# Patient Record
Sex: Female | Born: 1935 | Race: White | Hispanic: No | Smoking: Former smoker
Health system: Southern US, Community
[De-identification: ages and names within clinical notes are randomized; demographics above are authoritative.]

## PROBLEM LIST (undated history)

## (undated) ENCOUNTER — Emergency Department: Discharge status: Absent without leave | Payer: Medicare Other

## (undated) DIAGNOSIS — F419 Anxiety disorder, unspecified: Secondary | ICD-10-CM

## (undated) DIAGNOSIS — K859 Acute pancreatitis without necrosis or infection, unspecified: Secondary | ICD-10-CM

## (undated) DIAGNOSIS — M1990 Unspecified osteoarthritis, unspecified site: Secondary | ICD-10-CM

## (undated) DIAGNOSIS — K649 Unspecified hemorrhoids: Secondary | ICD-10-CM

## (undated) DIAGNOSIS — J449 Chronic obstructive pulmonary disease, unspecified: Secondary | ICD-10-CM

## (undated) DIAGNOSIS — K589 Irritable bowel syndrome without diarrhea: Secondary | ICD-10-CM

## (undated) DIAGNOSIS — K5790 Diverticulosis of intestine, part unspecified, without perforation or abscess without bleeding: Secondary | ICD-10-CM

## (undated) DIAGNOSIS — B029 Zoster without complications: Secondary | ICD-10-CM

## (undated) DIAGNOSIS — K222 Esophageal obstruction: Secondary | ICD-10-CM

## (undated) DIAGNOSIS — E785 Hyperlipidemia, unspecified: Secondary | ICD-10-CM

## (undated) DIAGNOSIS — K2270 Barrett's esophagus without dysplasia: Secondary | ICD-10-CM

## (undated) DIAGNOSIS — K449 Diaphragmatic hernia without obstruction or gangrene: Secondary | ICD-10-CM

## (undated) DIAGNOSIS — F329 Major depressive disorder, single episode, unspecified: Secondary | ICD-10-CM

## (undated) DIAGNOSIS — F0390 Unspecified dementia without behavioral disturbance: Secondary | ICD-10-CM

## (undated) DIAGNOSIS — F32A Depression, unspecified: Secondary | ICD-10-CM

## (undated) HISTORY — DX: Diaphragmatic hernia without obstruction or gangrene: K44.9

## (undated) HISTORY — DX: Hyperlipidemia, unspecified: E78.5

## (undated) HISTORY — DX: Esophageal obstruction: K22.2

## (undated) HISTORY — DX: Depression, unspecified: F32.A

## (undated) HISTORY — DX: Acute pancreatitis without necrosis or infection, unspecified: K85.90

## (undated) HISTORY — PX: ABDOMINAL HYSTERECTOMY: SHX81

## (undated) HISTORY — DX: Chronic obstructive pulmonary disease, unspecified: J44.9

## (undated) HISTORY — DX: Irritable bowel syndrome, unspecified: K58.9

## (undated) HISTORY — DX: Unspecified hemorrhoids: K64.9

## (undated) HISTORY — DX: Unspecified osteoarthritis, unspecified site: M19.90

## (undated) HISTORY — DX: Zoster without complications: B02.9

## (undated) HISTORY — DX: Barrett's esophagus without dysplasia: K22.70

## (undated) HISTORY — DX: Anxiety disorder, unspecified: F41.9

## (undated) HISTORY — DX: Diverticulosis of intestine, part unspecified, without perforation or abscess without bleeding: K57.90

## (undated) HISTORY — DX: Major depressive disorder, single episode, unspecified: F32.9

## (undated) HISTORY — PX: CATARACT EXTRACTION: SUR2

## (undated) HISTORY — PX: TONSILLECTOMY: SUR1361

---

## 1996-08-20 DIAGNOSIS — B029 Zoster without complications: Secondary | ICD-10-CM

## 1996-08-20 HISTORY — DX: Zoster without complications: B02.9

## 1997-08-24 ENCOUNTER — Encounter: Admission: RE | Admit: 1997-08-24 | Discharge: 1997-10-21 | Payer: Self-pay | Admitting: Orthopedic Surgery

## 1998-01-13 ENCOUNTER — Other Ambulatory Visit: Admission: RE | Admit: 1998-01-13 | Discharge: 1998-01-13 | Payer: Self-pay | Admitting: Obstetrics & Gynecology

## 1999-01-11 ENCOUNTER — Other Ambulatory Visit: Admission: RE | Admit: 1999-01-11 | Discharge: 1999-01-11 | Payer: Self-pay | Admitting: Obstetrics & Gynecology

## 1999-01-18 ENCOUNTER — Ambulatory Visit (HOSPITAL_COMMUNITY): Admission: RE | Admit: 1999-01-18 | Discharge: 1999-01-18 | Payer: Self-pay | Admitting: Internal Medicine

## 2000-11-12 ENCOUNTER — Encounter: Admission: RE | Admit: 2000-11-12 | Discharge: 2000-11-12 | Payer: Self-pay | Admitting: Internal Medicine

## 2000-11-12 ENCOUNTER — Encounter: Payer: Self-pay | Admitting: Internal Medicine

## 2001-01-06 ENCOUNTER — Encounter (INDEPENDENT_AMBULATORY_CARE_PROVIDER_SITE_OTHER): Payer: Self-pay | Admitting: *Deleted

## 2001-01-06 ENCOUNTER — Other Ambulatory Visit: Admission: RE | Admit: 2001-01-06 | Discharge: 2001-01-06 | Payer: Self-pay | Admitting: Internal Medicine

## 2005-10-31 ENCOUNTER — Encounter (INDEPENDENT_AMBULATORY_CARE_PROVIDER_SITE_OTHER): Payer: Self-pay | Admitting: *Deleted

## 2005-10-31 ENCOUNTER — Encounter: Admission: RE | Admit: 2005-10-31 | Discharge: 2005-10-31 | Payer: Self-pay | Admitting: Internal Medicine

## 2006-01-07 ENCOUNTER — Ambulatory Visit: Payer: Self-pay | Admitting: Internal Medicine

## 2006-01-23 ENCOUNTER — Ambulatory Visit: Payer: Self-pay | Admitting: Internal Medicine

## 2006-01-23 ENCOUNTER — Encounter: Payer: Self-pay | Admitting: Internal Medicine

## 2006-12-12 ENCOUNTER — Ambulatory Visit: Payer: Self-pay | Admitting: Internal Medicine

## 2008-04-08 ENCOUNTER — Encounter (INDEPENDENT_AMBULATORY_CARE_PROVIDER_SITE_OTHER): Payer: Self-pay | Admitting: *Deleted

## 2008-04-08 ENCOUNTER — Encounter: Payer: Self-pay | Admitting: Internal Medicine

## 2008-04-08 ENCOUNTER — Ambulatory Visit (HOSPITAL_COMMUNITY): Admission: RE | Admit: 2008-04-08 | Discharge: 2008-04-08 | Payer: Self-pay | Admitting: Obstetrics and Gynecology

## 2008-04-19 ENCOUNTER — Telehealth: Payer: Self-pay | Admitting: Internal Medicine

## 2008-04-21 DIAGNOSIS — J45909 Unspecified asthma, uncomplicated: Secondary | ICD-10-CM | POA: Insufficient documentation

## 2008-04-21 DIAGNOSIS — Z860100 Personal history of colon polyps, unspecified: Secondary | ICD-10-CM | POA: Insufficient documentation

## 2008-04-21 DIAGNOSIS — K589 Irritable bowel syndrome without diarrhea: Secondary | ICD-10-CM | POA: Insufficient documentation

## 2008-04-21 DIAGNOSIS — K449 Diaphragmatic hernia without obstruction or gangrene: Secondary | ICD-10-CM | POA: Insufficient documentation

## 2008-04-21 DIAGNOSIS — H269 Unspecified cataract: Secondary | ICD-10-CM | POA: Insufficient documentation

## 2008-04-21 DIAGNOSIS — F329 Major depressive disorder, single episode, unspecified: Secondary | ICD-10-CM | POA: Insufficient documentation

## 2008-04-21 DIAGNOSIS — K5730 Diverticulosis of large intestine without perforation or abscess without bleeding: Secondary | ICD-10-CM | POA: Insufficient documentation

## 2008-04-21 DIAGNOSIS — K859 Acute pancreatitis without necrosis or infection, unspecified: Secondary | ICD-10-CM | POA: Insufficient documentation

## 2008-04-21 DIAGNOSIS — F3289 Other specified depressive episodes: Secondary | ICD-10-CM | POA: Insufficient documentation

## 2008-04-21 DIAGNOSIS — K222 Esophageal obstruction: Secondary | ICD-10-CM | POA: Insufficient documentation

## 2008-04-21 DIAGNOSIS — F411 Generalized anxiety disorder: Secondary | ICD-10-CM | POA: Insufficient documentation

## 2008-04-23 ENCOUNTER — Ambulatory Visit: Payer: Self-pay | Admitting: Internal Medicine

## 2008-04-23 LAB — CONVERTED CEMR LAB
ALT: 13 units/L (ref 0–35)
AST: 20 units/L (ref 0–37)
Albumin: 4 g/dL (ref 3.5–5.2)
Alkaline Phosphatase: 60 units/L (ref 39–117)
Amylase: 102 units/L (ref 27–131)
Bilirubin, Direct: 0.1 mg/dL (ref 0.0–0.3)
Lipase: 38 units/L (ref 11.0–59.0)
Total Bilirubin: 0.8 mg/dL (ref 0.3–1.2)
Total Protein: 7.3 g/dL (ref 6.0–8.3)

## 2008-04-29 ENCOUNTER — Ambulatory Visit (HOSPITAL_COMMUNITY): Admission: RE | Admit: 2008-04-29 | Discharge: 2008-04-29 | Payer: Self-pay | Admitting: Internal Medicine

## 2010-09-10 ENCOUNTER — Encounter: Payer: Self-pay | Admitting: Internal Medicine

## 2010-09-14 ENCOUNTER — Telehealth: Payer: Self-pay | Admitting: Internal Medicine

## 2010-09-15 ENCOUNTER — Other Ambulatory Visit: Payer: Self-pay | Admitting: Physician Assistant

## 2010-09-15 ENCOUNTER — Ambulatory Visit
Admission: RE | Admit: 2010-09-15 | Discharge: 2010-09-15 | Payer: Self-pay | Source: Home / Self Care | Attending: Internal Medicine | Admitting: Internal Medicine

## 2010-09-15 DIAGNOSIS — J449 Chronic obstructive pulmonary disease, unspecified: Secondary | ICD-10-CM | POA: Insufficient documentation

## 2010-09-15 DIAGNOSIS — K219 Gastro-esophageal reflux disease without esophagitis: Secondary | ICD-10-CM | POA: Insufficient documentation

## 2010-09-15 DIAGNOSIS — J4489 Other specified chronic obstructive pulmonary disease: Secondary | ICD-10-CM | POA: Insufficient documentation

## 2010-09-15 DIAGNOSIS — K649 Unspecified hemorrhoids: Secondary | ICD-10-CM | POA: Insufficient documentation

## 2010-09-15 DIAGNOSIS — K5732 Diverticulitis of large intestine without perforation or abscess without bleeding: Secondary | ICD-10-CM | POA: Insufficient documentation

## 2010-09-15 DIAGNOSIS — R1032 Left lower quadrant pain: Secondary | ICD-10-CM | POA: Insufficient documentation

## 2010-09-15 DIAGNOSIS — M129 Arthropathy, unspecified: Secondary | ICD-10-CM | POA: Insufficient documentation

## 2010-09-15 DIAGNOSIS — E785 Hyperlipidemia, unspecified: Secondary | ICD-10-CM | POA: Insufficient documentation

## 2010-09-15 LAB — CBC WITH DIFFERENTIAL/PLATELET
Basophils Absolute: 0 10*3/uL (ref 0.0–0.1)
Basophils Relative: 0.3 % (ref 0.0–3.0)
Eosinophils Absolute: 0.1 10*3/uL (ref 0.0–0.7)
Eosinophils Relative: 0.5 % (ref 0.0–5.0)
HCT: 37.6 % (ref 36.0–46.0)
Hemoglobin: 12.9 g/dL (ref 12.0–15.0)
Lymphocytes Relative: 15 % (ref 12.0–46.0)
Lymphs Abs: 1.7 10*3/uL (ref 0.7–4.0)
MCHC: 34.2 g/dL (ref 30.0–36.0)
MCV: 92 fl (ref 78.0–100.0)
Monocytes Absolute: 0.8 10*3/uL (ref 0.1–1.0)
Monocytes Relative: 7.1 % (ref 3.0–12.0)
Neutro Abs: 8.9 10*3/uL — ABNORMAL HIGH (ref 1.4–7.7)
Neutrophils Relative %: 77.1 % — ABNORMAL HIGH (ref 43.0–77.0)
Platelets: 248 10*3/uL (ref 150.0–400.0)
RBC: 4.09 Mil/uL (ref 3.87–5.11)
RDW: 13.4 % (ref 11.5–14.6)
WBC: 11.5 10*3/uL — ABNORMAL HIGH (ref 4.5–10.5)

## 2010-09-21 NOTE — Progress Notes (Signed)
Summary: TRIAGE-? DIVERTICULITIS   Phone Note Call from Patient Call back at Home Phone [phone]   Caller: Patient Call For: Debbie Morris Reason for Call: Talk to Nurse Summary of Call: PT STILL HAS DISCONFORT ON HER LLQ WOULD LIKE TO SPEAK TO NURSE Initial call taken by: Tawni Levy,  April 19, 2008 3:24 PM  Follow-up for Phone Call        Pt. states she has had a CT by Dr.Mysinger and labs by Dr.Polite and needs an appointment to review CT with Dr.Brodie. ?diverticulitis.  1) Appt. with Dr.Brodie on 04-23-08 at 2:30pm 2) I have requested records from Dr.Polite and Dr.mysinger, to be faxed over. 3) Pt. instructed to call back as needed.  Follow-up by: Laureen Ochs LPN,  April 19, 2008 3:48 PM

## 2010-09-21 NOTE — Procedures (Signed)
Summary: EGD   EGD  Procedure date:  01/23/2006  Findings:      Location: [facility]   Patient Name: Debbie Morris, [name] MRN:  Procedure Procedures: Panendoscopy (EGD) CPT: 43235.    with biopsy(s)/brushing(s). CPT: D1846139.    with esophageal dilation. CPT: G9296129.  Personnel: Endoscopist: Dora L. Juanda Chance, MD.  Exam Location: Exam performed in Outpatient Clinic. Outpatient  Patient Consent: Procedure, Alternatives, Risks and Benefits discussed, consent obtained, from patient. Consent was obtained by the RN.  Indications Symptoms: Dysphagia. Chest Pain. Reflux symptoms  Surveillance of: 2002.  History  Current Medications: Patient is taking a non-steroidal medication. Patient is not currently taking Coumadin.  Pre-Exam Physical: Performed Jan 23, 2006  Entire physical exam was normal.  Comments: Pt. history reviewed/updated, physical exam performed prior to initiation of sedation?yes Exam Exam Info: Maximum depth of insertion Duodenum, intended Duodenum. Vocal cords visualized. Gastric retroflexion performed. Images taken. ASA Classification: II. Tolerance: good.  Sedation Meds: Patient assessed and found to be appropriate for moderate (conscious) sedation. Fentanyl 50 mcg. given IV. Versed 7.5 mg. given IV. Cetacaine Spray 2 sprays given aerosolized.  Monitoring: BP and pulse monitoring done. Oximetry used. Supplemental O2 given  Findings - STRICTURE / STENOSIS: Stricture in Distal Esophagus.  Constriction: partial. Etiology: benign due to reflux. 41 cm from mouth. Lumen diameter is 16 mm. Biopsy of Stricture/Steno  taken. ICD9: Esophageal Stricture: 530.3.  - Dilation: Distal Esophagus. Savary dilator used, Diameter: 16 mm, Minimal Resistance, Minimal Heme present on extraction. 1  total dilators used. Patient tolerance excellent. Outcome: successful.  - HIATAL HERNIA: 2 cms. in length. reducible hernia. ICD9: Hernia, Hiatal: 553.3. Normal: Antrum.    Normal: Duodenal Apex.   Assessment Abnormal examination, see findings above.  Diagnoses: 553.3: Hernia, Hiatal.  530.3: Esophageal Stricture.   Comments: nonobstructing stricture, s/p passage of 16 mm dilator Events  Unplanned Intervention: No unplanned interventions were required.  Unplanned Events: There were no complications. Plans Medication(s): Await pathology. PPI: Omeprazole/Prilosec 20 mg QD, starting Jan 23, 2006   Disposition: After procedure patient sent to recovery. After recovery patient sent home.   PATHOLOGY  FINAL DIAGNOSIS    ***MICROSCOPIC EXAMINATION AND DIAGNOSIS***    1. ESOPHAGUS, GASTROESOPHAGEAL JUNCTION, ENDOSCOPIC BIOPSY:   - GASTRIC OXYNTOCARDIAC-TYPE MUCOSA WITH MILD CHRONIC   INFLAMMATION,   NON-SPECIFIC.   - NO SQUAMOUS MUCOSA PRESENT.   - NO INTESTINAL METAPLASIA, DYSPLASIA, OR MALIGNANCY   IDENTIFIED.    2. COLON, CECUM, POLYP: COLON, POLYP: TUBULAR ADENOMA. NO HIGH   GRADE DYSPLASIA OR MALIGNANCY IDENTIFIED.    COMMENT   1. An Alcian Blue stain is performed to determine the presence of   intestinal metaplasia (goblet cell metaplasia). No intestinal   metaplasia (goblet cell metaplasia) is identified with the Alcian   Blue stain. There are a small number of Alcian Blue-positive   gastric-type mucous glands; however, no cells with the   appropriate shape of goblet cells are identified. The control   stained appropriately. (LI:gt, 01/25/06)    gdt   Date Reported: 01/26/2006 Sonia Side, MD   *** Electronically Signed Out By LI ***    Clinical information   Dysphagia, screening colon R/O Barrett' s (jes)    specimen(s) obtained   1: Esophagogastric junction, biopsy   2: Colon, polyp(s), cecum    Gross Description   1. Received in formalin are tan, soft tissue fragments that are   submitted in toto. Number: 2   Size: 0.3 and 0.4 cm,  total in cassette    2. Received in formalin is a tan, soft tissue fragment that is    submitted in toto. Size: 0.2 cm total in cassette   (BM:jes, 01/25/06) This report was created from the original endoscopy report, which was reviewed and signed by the above listed endoscopist.

## 2010-09-21 NOTE — Progress Notes (Signed)
Summary: [place]  Patient has not been seen si Phone Note Call from Patient Call back at Home Phone [phone]   Caller: Patient Call For: Dr. Juanda Chance Reason for Call: Talk to Nurse Summary of Call: Pt is having a lot of discomfort and does not think she can make it through another night without having some medication or coming in to see [place] Initial call taken by: [place] Johnson,  September 14, 2010 11:06 AM  Follow-up for Phone Call        Patient stated she's been having abdominal pain x 1 week. Pain is located along her pubic hairline toward the left and is like cramping when you need to have a BM. She also had diarrhea also but started taking Align, Imodium and GasX and it's better. Pain kept her up all night. Patient has old Bentyl tabs. Patient given an appointment with Mike Gip PAC in am. Dr Juanda Chance, can I refill the Bentyl? Thanks.Graciella Freer RN  September 14, 2010 3:09 PM  Chart requested. Graciella Freer RN  September 14, 2010 3:14 PM   Additional Follow-up for Phone Call Additional follow up Details #1::        yes, OK to refill Bentyl 10mg , 1 by mouth ac, as needed crampy abd. pain. Pt to see Amy. Additional Follow-up by: Hart Carwin MD,  September 14, 2010 5:34 PM    Additional Follow-up for Phone Call Additional follow up Details #2::    Spoke with patient and she took Bentyl - expired tabs- yesterday after our conversation. Ok per her to refill per Dr Regino Schultze note. Follow-up by: Graciella Freer RN,  September 15, 2010 8:21 AM  Prescriptions: BENTYL 10 MG CAPS (DICYCLOMINE HCL) Take 1 tablet by mouth as needed for abdominal spasms  #30 x 2   Entered by:   Graciella Freer RN   Authorized by:   Hart Carwin MD   Signed by:   Graciella Freer RN on 09/15/2010   Method used:   [facility]  [address] [phone]* (retail)       [address]       [place], [place]  [id number]       Ph: [phone] or [phone]  Fax: [phone]   RxID:   [id number]

## 2010-09-21 NOTE — Procedures (Signed)
Summary: COLON   Procedures Next Due Date:    Colonoscopy: 01/2011  Patient Name: Debbie, Morris MRN:  Procedure Procedures: Colonoscopy CPT: 3651194019.    with biopsy. CPT: Q5068410.  Personnel: Endoscopist: Dora L. Juanda Chance, MD.  Exam Location: Exam performed in Outpatient Clinic. Outpatient  Patient Consent: Procedure, Alternatives, Risks and Benefits discussed, consent obtained, from patient. Consent was obtained by the RN.  Indications  Surveillance of: Adenomatous Polyp(s). Initial polypectomy was performed in 1998. 1-2 Polyps were found at Index Exam. Previous surveillance exam(s) in  2002,  History  Current Medications: Patient is taking an non-steroidal medication. Patient is not currently taking Coumadin.  Pre-Exam Physical: Performed Jan 23, 2006. Entire physical exam was normal.  Comments: Pt. history reviewed/updated, physical exam performed prior to initiation of sedation?yes Exam Exam: Extent of exam reached: Cecum, extent intended: Cecum.  The cecum was identified by appendiceal orifice and IC valve. Colon retroflexion performed. Images taken. ASA Classification: II. Tolerance: excellent.  Monitoring: Pulse and BP monitoring, Oximetry used. Supplemental O2 given.  Colon Prep Used Miralax for colon prep. Prep results: good.  Sedation Meds: Patient assessed and found to be appropriate for moderate (conscious) sedation. Fentanyl 50 mcg. given IV. Versed 4.5 mg. given IV.  Findings POLYP: Cecum, Maximum size: 3 mm. diminutive, sessile polyp. Distance from Anus 110 cm. Procedure:  biopsy without cautery, The polyp was removed piece meal. removed, retrieved, Polyp sent to pathology. ICD9: Colon Polyps: 211.3.  - DIVERTICULOSIS: Descending Colon to Sigmoid Colon. ICD9: Diverticulosis: 562.10. Comments: left and right colon diverticulosis.  - NORMAL EXAM: Rectum.   Assessment Abnormal examination, see findings above.  Diagnoses: 211.3: Colon Polyps.    562.10: Diverticulosis.   Comments: diminutive cecal polyp removed Events  Unplanned Interventions: No intervention was required.  Unplanned Events: There were no complications. Plans Medication Plan: Await pathology.  Patient Education: Patient given standard instructions for: Yearly hemoccult testing recommended. Patient instructed to get routine colonoscopy every 5 years.  Disposition: After procedure patient sent to recovery. After recovery patient sent home.   PATHOLOGY  FINAL DIAGNOSIS    ***MICROSCOPIC EXAMINATION AND DIAGNOSIS***    1. ESOPHAGUS, GASTROESOPHAGEAL JUNCTION, ENDOSCOPIC BIOPSY:   - GASTRIC OXYNTOCARDIAC-TYPE MUCOSA WITH MILD CHRONIC   INFLAMMATION,   NON-SPECIFIC.   - NO SQUAMOUS MUCOSA PRESENT.   - NO INTESTINAL METAPLASIA, DYSPLASIA, OR MALIGNANCY   IDENTIFIED.    2. COLON, CECUM, POLYP: COLON, POLYP: TUBULAR ADENOMA. NO HIGH   GRADE DYSPLASIA OR MALIGNANCY IDENTIFIED.    COMMENT   1. An Alcian Blue stain is performed to determine the presence of   intestinal metaplasia (goblet cell metaplasia). No intestinal   metaplasia (goblet cell metaplasia) is identified with the Alcian   Blue stain. There are a small number of Alcian Blue-positive   gastric-type mucous glands; however, no cells with the   appropriate shape of goblet cells are identified. The control   stained appropriately. (LI:gt, 01/25/06)    gdt   Date Reported: 01/26/2006 Sonia Side, MD   *** Electronically Signed Out By LI ***    Clinical information   Dysphagia, screening colon R/O Barrett' s (jes)    specimen(s) obtained   1: Esophagogastric junction, biopsy   2: Colon, polyp(s), cecum    Gross Description   1. Received in formalin are tan, soft tissue fragments that are   submitted in toto. Number: 2   Size: 0.3 and 0.4 cm, total in cassette    2. Received in formalin is a  tan, soft tissue fragment that is   submitted in toto. Size: 0.2 cm total in cassette    (BM:jes, 01/25/06)  This report was created from the original endoscopy report, which was reviewed and signed by the above listed endoscopist.

## 2010-09-21 NOTE — Assessment & Plan Note (Signed)
Summary: abdominal pain x 1 week    History of Present Illness Visit Type: new patient  Primary GI MD: Lina Sar MD Primary Provider: Elby Showers, MD  Requesting Provider: na Chief Complaint: Lower abd pain, diarrhea, constipation, bloating, rectal pain, and hemorrhoids  History of Present Illness:   Debbie Morris 77 YO FEMALE KNOWN TO DR. Juanda Chance SHE HAS HX OF GERD,COLON POLYPS,DIVERTICULOSIS, AND HAD AN EPISODE OF PANCREATITIS ABOUT 3 YEARS AGO. LAST COLONOSCOPY WAS DONE IN 2007-HAS LEFT AND RIGHT COLON DIVERTICULOSIS,ONE TINY ADENOMATOUS POLYP. SHE COMES IN TODAY AS AN ADD ON WITH C/O LOWER ABDOMINAL PAIN OVER THE PAST 5 DAYS. PAIN HAS BEEN INTENSE AT TIMES. SHE DESCRIBES A HEAVY PRESSURE IN HER LOWER ABDOMEN,SHARP JABBING PAINS, SOMETIMES EVEN IN TO HER RECTUM. SHE HAS BEEN UNCOMFORTABLE WALKING, AND CANNOT LIE ON LEFT SIDE. NO FEVER, CHILLS, NO N/V. HAS HAD Debbie LITTLE BM PAST COUPLE DAYS AND STOOL NARROW AND IN PIECES, NO BLOOD. NO DYSURIA, THOUGH HURTS HER ABDOMEN TO STRAIN.PAIN IS MORE TO LEFT SIDE.    GI Review of Systems    Reports abdominal pain and  bloating.     Location of  Abdominal pain: lower abdomen.    Denies acid reflux, belching, chest pain, dysphagia with liquids, dysphagia with solids, heartburn, loss of appetite, nausea, vomiting, vomiting blood, weight loss, and  weight gain.      Reports change in bowel habits, constipation, diarrhea, hemorrhoids, and  rectal pain.     Denies anal fissure, black tarry stools, diverticulosis, fecal incontinence, heme positive stool, irritable bowel syndrome, jaundice, light color stool, liver problems, and  rectal bleeding.    Current Medications (verified): 1)  Calcium .... Take 1 Tablet By Mouth Once A Day 2)  Vitamin D .... Take 1 Tablet By Mouth Once A Day 3)  Celexa 10 Mg Tabs (Citalopram Hydrobromide) .... Take 1 Tablet By Mouth Once A Day 4)  Multivitamins  Tabs (Multiple Vitamin) .... Take 1 Tablet By Mouth Once A Day 5)   Aspirin 81 Mg Tbec (Aspirin) .... Take 1 Tablet By Mouth Once A Day 6)  Premarin 0.3 Mg Tabs (Estrogens Conjugated) .Marland Kitchen.. 1 Once Daily 7)  Albuterol .... As Needed 8)  Vitamin B Complex-C   Caps (B Complex-C) .Marland Kitchen.. 1 Once Daily 9)  Bentyl 10 Mg Caps (Dicyclomine Hcl) .... Take 1 Tablet By Mouth As Needed For Abdominal Spasms 10)  Glucosamine-Chondroitin 250-200 Mg Caps (Glucosamine-Chondroitin) .... One Capsule By Mouth Once Daily 11)  Pravastatin Sodium 40 Mg Tabs (Pravastatin Sodium) .... One Tablet By Mouth Once Daily 12)  Gas-X 80 Mg Chew (Simethicone) .... As Needed 13)  Align  Caps (Probiotic Product) .... One Capsule By Mouth Once Daily  Allergies (verified): 1)  ! Pcn 2)  ! Erythromycin 3)  ! * Tetanus  Past History:  Past Medical History: HYPERLIPIDEMIA (ICD-272.4)  COPD (ICD-496) ARTHRITIS (ICD-716.90) ACUTE PANCREATITIS (ICD-577.0) CATARACTS (ICD-366.9) DEPRESSION (ICD-311) ANXIETY (ICD-300.00) ASTHMA, UNSPECIFIED (ICD-493.90) Hx of ESOPHAGEAL STRICTURE (ICD-530.3) HIATAL HERNIA (ICD-553.3) COLONIC POLYPS, ADENOMATOUS, HX OF (ICD-V12.72) DIVERTICULOSIS OF COLON (ICD-562.10) HEMORRHOIDS IRRITABLE BOWEL SYNDROME (ICD-564.1)  Past Surgical History: Hysterectomy Tonsillectomy Cataract Surgery  COLONOSCOPY 2007-BRODIE EGD 2007-BRODIE  Family History: Family History of Colon Cancer: Mother Family History of Prostate Cancer: Father Family History of Diabetes: Mother Stomach Cancer: MGM   Social History: [employer] AT [employer] 1/12 Divorced No childern  Alcohol Use - yes-on occasion Daily Caffeine Use Illicit Drug Use - no Patient has never smoked.  Smoking Status:  never  Review of Systems       The patient complains of arthritis/joint pain.  The patient denies allergy/sinus, anemia, anxiety-new, back pain, blood in urine, breast changes/lumps, change in vision, confusion, cough, coughing up blood, depression-new, fainting, fatigue, fever,  headaches-new, hearing problems, heart murmur, heart rhythm changes, itching, menstrual pain, muscle pains/cramps, night sweats, nosebleeds, pregnancy symptoms, shortness of breath, skin rash, sleeping problems, sore throat, swelling of feet/legs, swollen lymph glands, thirst - excessive , urination - excessive , urination changes/pain, urine leakage, vision changes, and voice change.         SEE HPI  Vital Signs:  Patient profile:   77 year old female Height:      62 inches Weight:      155 pounds BMI:     28.45 BSA:     1.72 Pulse rate:   68 / minute Pulse rhythm:   regular BP sitting:   112 / 58  (left arm) Cuff size:   regular  Vitals Entered By: Ok Anis CMA (September 15, 2010 10:46 AM)  Physical Exam  General:  Well developed, well nourished, no acute distress. Head:  Normocephalic and atraumatic. Eyes:  PERRLA, no icterus. Lungs:  Clear throughout to auscultation. Heart:  Regular rate and rhythm; no murmurs, rubs,  or bruits. Abdomen:  SOFT, TENDER LLQ/LMQ, MILD SUPRAPUBIC, NO MASS OR HSM, NO GUARDING OR REBOUND Rectal:  NOT DONE Extremities:  No clubbing, cyanosis, edema or deformities noted. Neurologic:  Alert and  oriented x4;  grossly normal neurologically. Psych:  Alert and cooperative. Normal mood and affect.   Impression & Recommendations:  Problem # 1:  DIVERTICULITIS OF COLON (ICD-562.11) Assessment New 77 YO FEMALE WITH ACUTE DIVERTICULITIS WITH 5 DAY HX OF LLQ PAIN  SOFT/BLAND DIET BENTYL 10 MG 3 X DAILY AS NEEDED FOR CRAMPING TYLENOL AS NEEDED FOR PAIN START CIPRO 500 MG TWICE DAILY X 2 WEEKS FLAGYL 500 MG TWICE DAILY X 2 WEEKS ROV WITH DR. Lanny Cramp WILL BE DUE FOR FOLLOW UP COLONOSCOPY PT  AWARE SHE SHOULD CALL IF HER SXS WORSEN AT ANY POINT, OR IF SHE HAS PERSISTENT SXS AFTER COMPLETING THE ANTIBIOTICS. Orders: TLB-CBC Platelet - w/Differential ([id number])  Problem # 2:  IRRITABLE BOWEL SYNDROME (ICD-564.1) Assessment: Comment Only  Problem  # 3:  COLONIC POLYPS,,ADENOMATOUS HX OF (ICD-V12.72) Assessment: Comment Only DUE FOR FLLOW UP  01/2011   Problem # 4:  ASTHMA, UNSPECIFIED (ICD-493.90) Assessment: Comment Only  Patient Instructions: 1)  Please go to lab, basement level. 2)  We sent perscriptions for the Antibiotics and the [facility]ol [facility] [address]  3)  We made you a follow up appointment with Dr. Juanda Chance, appointment card given. 4)  Copy sent to : Elby Showers, MD 5)  The medication list was reviewed and reconciled.  All changed / newly prescribed medications were explained.  A complete medication list was provided to the patient / caregiver. Prescriptions: METRONIDAZOLE 500 MG TABS (METRONIDAZOLE) Take 1 tab twice x 14 days  #28 x 0   Entered by:   Lowry Ram NCMA   Authorized by:   Sammuel Cooper PA-c   Signed by:   Lowry Ram NCMA on 09/15/2010   Method used:   [facility]  [address] [phone][facility] (retail)       [address]       [place], [place]  [address]  Ph: [phone] or [phone]       Fax: [phone]   RxID:   [id number] CIPRO 500 MG TABS (CIPROFLOXACIN HCL) Take 1 tab twice daily x 14 days  #28 x 0   Entered by:   Lowry Ram NCMA   Authorized by:   Sammuel Cooper PA-c   Signed by:   Lowry Ram NCMA on 09/15/2010   Method used:   [facility]        [facility]  [address] [phone]* (retail)       [address]       [place], [place]  [id number]       Ph: [phone] or [phone]       Fax: [phone]   RxID:   [id number] ANUSOL-HC 25 MG SUPP (HYDROCORTISONE ACETATE) Use 1 suppository at bedtime as needed  #15 x 2   Entered by:   Lowry Ram NCMA   Authorized by:   Sammuel Cooper PA-c   Signed by:   Lowry Ram NCMA on 09/15/2010   Method used:   [facility]        [facility]  [address] [phone]* (retail)       [address]       [place], [place]  [id number]       Ph: [phone] or [phone]       Fax: [phone]   RxID:   [id number]

## 2010-09-22 ENCOUNTER — Encounter (INDEPENDENT_AMBULATORY_CARE_PROVIDER_SITE_OTHER): Payer: Self-pay | Admitting: *Deleted

## 2010-09-22 ENCOUNTER — Telehealth: Payer: Self-pay | Admitting: Physician Assistant

## 2010-09-22 ENCOUNTER — Other Ambulatory Visit: Payer: Self-pay | Admitting: Physician Assistant

## 2010-09-22 ENCOUNTER — Other Ambulatory Visit: Payer: Medicare Other

## 2010-09-22 DIAGNOSIS — R1032 Left lower quadrant pain: Secondary | ICD-10-CM

## 2010-09-22 LAB — CBC WITH DIFFERENTIAL/PLATELET
Basophils Absolute: 0 10*3/uL (ref 0.0–0.1)
Basophils Relative: 0.4 % (ref 0.0–3.0)
Eosinophils Absolute: 0.1 10*3/uL (ref 0.0–0.7)
Eosinophils Relative: 1.4 % (ref 0.0–5.0)
HCT: 35.2 % — ABNORMAL LOW (ref 36.0–46.0)
Hemoglobin: 12 g/dL (ref 12.0–15.0)
Lymphocytes Relative: 24.3 % (ref 12.0–46.0)
Lymphs Abs: 1.4 10*3/uL (ref 0.7–4.0)
MCHC: 34.2 g/dL (ref 30.0–36.0)
MCV: 91.6 fl (ref 78.0–100.0)
Monocytes Absolute: 0.6 10*3/uL (ref 0.1–1.0)
Monocytes Relative: 11.4 % (ref 3.0–12.0)
Neutro Abs: 3.6 10*3/uL (ref 1.4–7.7)
Neutrophils Relative %: 62.5 % (ref 43.0–77.0)
Platelets: 257 10*3/uL (ref 150.0–400.0)
RBC: 3.85 Mil/uL — ABNORMAL LOW (ref 3.87–5.11)
RDW: 13.1 % (ref 11.5–14.6)
WBC: 5.7 10*3/uL (ref 4.5–10.5)

## 2010-09-22 LAB — URINALYSIS, ROUTINE W REFLEX MICROSCOPIC
Bilirubin Urine: NEGATIVE
Hgb urine dipstick: NEGATIVE
Ketones, ur: NEGATIVE
Nitrite: NEGATIVE
Specific Gravity, Urine: >=1.03 (ref 1.000–1.030)
Total Protein, Urine: NEGATIVE
Urine Glucose: NEGATIVE
Urobilinogen, UA: 0.2 (ref 0.0–1.0)
pH: 5.5 (ref 5.0–8.0)

## 2010-09-26 ENCOUNTER — Encounter: Payer: Self-pay | Admitting: Physician Assistant

## 2010-09-26 ENCOUNTER — Ambulatory Visit (INDEPENDENT_AMBULATORY_CARE_PROVIDER_SITE_OTHER): Payer: Medicare Other | Admitting: Physician Assistant

## 2010-09-26 DIAGNOSIS — K5732 Diverticulitis of large intestine without perforation or abscess without bleeding: Secondary | ICD-10-CM

## 2010-10-05 NOTE — Assessment & Plan Note (Signed)
Summary: abd pain, hx Brodie, No show, copay/[facility]   Vital Signs:  Patient profile:   77 year old female Height:      62 inches Weight:      155 pounds BMI:     28.45 Pulse rate:   72 / minute Pulse rhythm:   regular BP sitting:   118 / 52  (left arm)  Vitals Entered By: Chales Abrahams CMA Duncan Dull) (September 26, 2010 2:28 PM)  History of Present Illness Visit Type: Follow-up Visit Primary GI MD: Lina Sar MD Primary Provider: Elby Showers, MD  Requesting Provider: na Chief Complaint: stool looks like coffe grounds, pain much better History of Present Illness:   VERY NICE 77 YO FEMALE WHO COMES BACK IN TODAY FOR FOLLOW UP OF DIVERTICULITIS. SHE WAS SEEN 1/27 WITH ACUTE LOWER ABDOMINAL PAIN WHICH WAS FAIRLY INTENSE. SHE HAS BEEN ON CIPRO AND FLAGYL  OVER THE PAST 10 DAYS . SHE IS ACTUALLY FEELING QUITE A BIT BETTER. SHE CONTINUES TO HAVE SOME MILD LLQ PAIN WHICH SHE DESRIBES AS A DULL ACHE. NO FEVERS. BM'S SOFT PIECES. SHE SAYS HER URINE IS DARK. LABS WERE DONE  2/3/  WHEN SHE CALLED IN . WBC 5.7, HGB 12.0, UA NEGATIVE   GI Review of Systems    Reports abdominal pain.     Location of  Abdominal pain: lower abdomen.    Denies acid reflux, belching, bloating, chest pain, dysphagia with liquids, dysphagia with solids, heartburn, loss of appetite, nausea, vomiting, vomiting blood, weight loss, and  weight gain.        Denies anal fissure, black tarry stools, change in bowel habit, constipation, diarrhea, diverticulosis, fecal incontinence, heme positive stool, hemorrhoids, irritable bowel syndrome, jaundice, light color stool, liver problems, rectal bleeding, and  rectal pain.  Current Medications (verified): 1)  Calcium .... Take 1 Tablet By Mouth Once A Day 2)  Vitamin D .... Take 1 Tablet By Mouth Once A Day 3)  Celexa 10 Mg Tabs (Citalopram Hydrobromide) .... Take 1 Tablet By Mouth Once A Day 4)  Multivitamins  Tabs (Multiple Vitamin) .... Take 1 Tablet By Mouth Once A Day 5)   Aspirin 81 Mg Tbec (Aspirin) .... Take 1 Tablet By Mouth Once A Day 6)  Premarin 0.3 Mg Tabs (Estrogens Conjugated) .Marland Kitchen.. 1 Once Daily 7)  Albuterol .... As Needed 8)  Vitamin B Complex-C   Caps (B Complex-C) .Marland Kitchen.. 1 Once Daily 9)  Bentyl 10 Mg Caps (Dicyclomine Hcl) .... Take 1 Tablet By Mouth As Needed For Abdominal Spasms 10)  Glucosamine-Chondroitin 250-200 Mg Caps (Glucosamine-Chondroitin) .... One Capsule By Mouth Once Daily 11)  Pravastatin Sodium 40 Mg Tabs (Pravastatin Sodium) .... One Tablet By Mouth Once Daily 12)  Gas-X 80 Mg Chew (Simethicone) .... As Needed 13)  Align  Caps (Probiotic Product) .... One Capsule By Mouth Once Daily 14)  Anusol-Hc 25 Mg Supp (Hydrocortisone Acetate) .... Use 1 Suppository At Bedtime As Needed 15)  Cipro 500 Mg Tabs (Ciprofloxacin Hcl) .... Take 1 Tab Twice Daily X 14 Days 16)  Metronidazole 500 Mg Tabs (Metronidazole) .... Take 1 Tab Twice X 14 Days  Allergies (verified): 1)  ! Pcn 2)  ! Erythromycin 3)  ! * Tetanus  Past History:  Past Medical History: HYPERLIPIDEMIA (ICD-272.4) COPD (ICD-496) ARTHRITIS (ICD-716.90) ACUTE PANCREATITIS (ICD-577.0) CATARACTS (ICD-366.9) DEPRESSION (ICD-311) ANXIETY (ICD-300.00) ASTHMA, UNSPECIFIED (ICD-493.90) Hx of ESOPHAGEAL STRICTURE (ICD-530.3) HIATAL HERNIA (ICD-553.3) COLONIC POLYPS, ADENOMATOUS, HX OF (ICD-V12.72) DIVERTICULOSIS OF COLON (ICD-562.10) HEMORRHOIDS IRRITABLE BOWEL  SYNDROME (ICD-564.1)  Past Surgical History: Reviewed history from 09/15/2010 and no changes required. Hysterectomy Tonsillectomy Cataract Surgery  COLONOSCOPY 2007-[facility] EGD 2007-[facility]  Family History: Reviewed history from 09/15/2010 and no changes required. Family History of Colon Cancer: Mother Family History of Prostate Cancer: Father Family History of Diabetes: Mother Stomach Cancer: MGM   Social History: Reviewed history from 09/15/2010 and no changes required. Retired,QUIT JOB AT [employer]  1/12 Divorced No childern  Alcohol Use - yes-on occasion Daily Caffeine Use Illicit Drug Use - no Patient has never smoked.   Review of Systems  The patient denies allergy/sinus, anemia, anxiety-new, arthritis/joint pain, back pain, blood in urine, breast changes/lumps, change in vision, confusion, cough, coughing up blood, depression-new, fainting, fatigue, fever, headaches-new, hearing problems, heart murmur, heart rhythm changes, itching, menstrual pain, muscle pains/cramps, night sweats, nosebleeds, pregnancy symptoms, shortness of breath, skin rash, sleeping problems, sore throat, swelling of feet/legs, swollen lymph glands, thirst - excessive , urination - excessive , urination changes/pain, urine leakage, vision changes, and voice change.         SEE HPI  Physical Exam  General:  Well developed, well nourished, no acute distress. Head:  Normocephalic and atraumatic. Eyes:  PERRLA, no icterus. Lungs:  Clear throughout to auscultation. Heart:  Regular rate and rhythm; no murmurs, rubs,  or bruits. Abdomen:  SOFT, MINIMALLY TENDER LLQ, NO GUARDING, NO REBOUND BS+ Rectal:  NOT DONE Extremities:  No clubbing, cyanosis, edema or deformities noted. Neurologic:  Alert and  oriented x4;  grossly normal neurologically. Psych:  Alert and cooperative. Normal mood and affect.  Impression & Recommendations:  Problem # 1:  DIVERTICULITIS OF COLON (ICD-562.11) Assessment Improved 77 YO FEMALE WITH ACUTE DIVERTICULITIS-IMPROVING. SHE IS ADVISED TO COMPLETE THE 14 DAY COURSE OF CIPRO AND FLAGYL RESUME BENEFIBER RESUME ALIGN ONE DAILY ADVANCE TO REGULAR DIET. SHE WILL CALL AFTER SHE FINISHES THE ABX-IF ANY PERSISTENT PAIN WOULD TREAT WITH ANOTHER WEEK OF CIPRO 500 two times a day, AND FLAGYL 500 MG two times a day. SHE HAS  A ROUTINE FOLLOW UP WITH DR. Juanda Chance IN [facility].  Patient Instructions: 1)  Follow up as needed. 2)  The medication list was reviewed and reconciled.  All changed /  newly prescribed medications were explained.  A complete medication list was provided to the patient / caregiver.

## 2010-10-05 NOTE — Progress Notes (Signed)
Summary: Triage   Phone Note Call from Patient Call back at Home Phone [phone]   Caller: Patient Call For: Debbie Morris Reason for Call: Talk to Nurse Summary of Call: Dark stools, dark urine Initial call taken by: Karna Christmas,  September 22, 2010 10:09 AM  Follow-up for Phone Call        Patient calling to let us know how she is doing. States the severe pain is gone but the left lower side is still tender and a constant dull ache. Stools are dark in color and mushy. Patient could not tell me if they were dark brown or black. States she had 3-4 stools/day. Denies bleeding. She it taking her antibiotics and eating a bland diet. She has not taken the Bentyl in several days. She states she stopped her Pravastatin on her own. She wants to know if she needs an xray. Please, advise. Follow-up by: Jesse Fall RN,  September 22, 2010 10:48 AM  Additional Follow-up for Phone Call Additional follow up Details #1::        I dont think she needs a ct based on what she is saying -she needs to finish abx. I can see her on tuesday. would ask her to come get a cbc,and ua today or monday thanks Additional Follow-up by: Peterson Ao,  September 22, 2010 11:29 AM    Additional Follow-up for Phone Call Additional follow up Details #2::    Spoke with patient. She will come in today for labs. Scheduled her to see Debbie Gip, PA  on 09/26/10 at 2:30 PM.  Follow-up by: Jesse Fall RN,  September 22, 2010 1:41 PM

## 2010-10-23 ENCOUNTER — Encounter: Payer: Self-pay | Admitting: Internal Medicine

## 2010-10-23 ENCOUNTER — Ambulatory Visit (INDEPENDENT_AMBULATORY_CARE_PROVIDER_SITE_OTHER): Payer: Medicare Other | Admitting: Internal Medicine

## 2010-10-23 DIAGNOSIS — K5732 Diverticulitis of large intestine without perforation or abscess without bleeding: Secondary | ICD-10-CM

## 2010-10-31 NOTE — Assessment & Plan Note (Signed)
Summary: F/U abdominal pain, saw Mike Gip PA    History of Present Illness Visit Type: Follow-up Visit Primary GI MD: Lina Sar MD Primary Provider: Elby Showers, MD  Requesting Provider: na Chief Complaint: F/u for abd pain and office visit with Amy. Pt states that she is much better and denies any GI complaints  History of Present Illness:   This is a 77 year old white female with an episode of left lower quadrant abdominal pain evaluated by the PA on 09/15/10 at which time she was diagnosed as having diverticulitis. She was put on a full liquid diet ,Flagyl and Cipro and improved within several days. She has a history of pseudomembranous colitis in 1985 requiring hospitalization. Her last colonoscopy in June 2007 showed a tubular adenoma. She has a family history of colon cancer in her mother. A prior colonoscopy in 2002 and 1998 showed hyperplastic polyps. She has a history of gastroesophageal reflux. Her last upper endoscopy in June 2007 showed an esophageal stricture which was dilated with a 16 mm dilator. She had a 2 cm hiatal hernia. A CT scan of the abdomen in August 2009 for abdominal pain showed acute pancolitis. She is today 90% improved.   GI Review of Systems      Denies abdominal pain, acid reflux, belching, bloating, chest pain, dysphagia with liquids, dysphagia with solids, heartburn, loss of appetite, nausea, vomiting, vomiting blood, weight loss, and  weight gain.        Denies anal fissure, black tarry stools, change in bowel habit, constipation, diarrhea, diverticulosis, fecal incontinence, heme positive stool, hemorrhoids, irritable bowel syndrome, jaundice, light color stool, liver problems, rectal bleeding, and  rectal pain.    Current Medications (verified): 1)  Calcium .... Take 1 Tablet By Mouth Two Times A Day 2)  Vitamin D .... Take 1 Tablet By Mouth Two Times A Day 3)  Celexa 10 Mg Tabs (Citalopram Hydrobromide) .... Take 1 Tablet By Mouth Once A  Day 4)  Multivitamins  Tabs (Multiple Vitamin) .... Take 1 Tablet By Mouth Once A Day 5)  Aspirin 81 Mg Tbec (Aspirin) .... Take 1 Tablet By Mouth Once A Day 6)  Albuterol .... As Needed 7)  Bentyl 10 Mg Caps (Dicyclomine Hcl) .... Take 1 Tablet By Mouth As Needed For Abdominal Spasms 8)  Pravastatin Sodium 40 Mg Tabs (Pravastatin Sodium) .... One Tablet By Mouth Once Daily 9)  Gas-X 80 Mg Chew (Simethicone) .... As Needed 10)  [facility] Senior Probiotic  Caps (Probiotic Product) .... One Tablet By Mouth Once Daily 11)  Anusol-Hc 25 Mg Supp (Hydrocortisone Acetate) .... Use 1 Suppository At Bedtime As Needed 12)  Digestive Health(Pro Biotic) .... Once Daily 13)  Glucosamine Chondroitin Complx  Caps (Glucosamine-Chondroit-Vit C-Mn) .... One Capsule By Mouth Once Daily 14)  Benefiber  Powd (Wheat Dextrin) .... Once Daily 15)  Lutein 20 Mg Caps (Lutein) .... One Capsule By Mouth Once Daily 16)  Voltaren 1 % Gel (Diclofenac Sodium) .... As Needed  Allergies (verified): 1)  ! Pcn 2)  ! Erythromycin 3)  ! * Tetanus  Past History:  Past Medical History: Reviewed history from 09/26/2010 and no changes required. HYPERLIPIDEMIA (ICD-272.4) COPD (ICD-496) ARTHRITIS (ICD-716.90) ACUTE PANCREATITIS (ICD-577.0) CATARACTS (ICD-366.9) DEPRESSION (ICD-311) ANXIETY (ICD-300.00) ASTHMA, UNSPECIFIED (ICD-493.90) Hx of ESOPHAGEAL STRICTURE (ICD-530.3) HIATAL HERNIA (ICD-553.3) COLONIC POLYPS, ADENOMATOUS, HX OF (ICD-V12.72) DIVERTICULOSIS OF COLON (ICD-562.10) HEMORRHOIDS IRRITABLE BOWEL SYNDROME (ICD-564.1)  Past Surgical History: Reviewed history from 09/15/2010 and no changes required. Hysterectomy Tonsillectomy Cataract  Surgery  COLONOSCOPY 2007-BR[facility]IE EGD 2007-[facility]  Family History: Reviewed history from 09/15/2010 and no changes required. Family History of Colon Cancer: Mother Family History of Prostate Cancer: Father Family History of Diabetes: Mother Stomach Cancer: MGM   Social  History: Reviewed history from 09/15/2010 and no changes required. Retired,QUIT JOB AT [employer] 1/12 Divorced No childern  Alcohol Use - yes-on occasion Daily Caffeine Use Illicit Drug Use - no Patient has never smoked.   Review of Systems  The patient denies allergy/sinus, anemia, anxiety-new, arthritis/joint pain, back pain, blood in urine, breast changes/lumps, change in vision, confusion, cough, coughing up blood, depression-new, fainting, fatigue, fever, headaches-new, hearing problems, heart murmur, heart rhythm changes, itching, menstrual pain, muscle pains/cramps, night sweats, nosebleeds, pregnancy symptoms, shortness of breath, skin rash, sleeping problems, sore throat, swelling of feet/legs, swollen lymph glands, thirst - excessive , urination - excessive , urination changes/pain, urine leakage, vision changes, and voice change.         Pertinent positive and negative review of systems were noted in the above HPI. All other ROS was otherwise negative.   Vital Signs:  Patient profile:   77 year old female Height:      62 inches Weight:      154 pounds BMI:     28.27 BSA:     1.71 Pulse rate:   64 / minute Pulse rhythm:   regular BP sitting:   124 / 68  (left arm) Cuff size:   regular  Vitals Entered By: Ok Anis CMA (October 23, 2010 11:47 AM)  Physical Exam  General:  Well developed, well nourished, no acute distress. Eyes:  PERRLA, no icterus. Mouth:  No deformity or lesions, dentition normal. Neck:  Supple; no masses or thyromegaly. Lungs:  Clear throughout to auscultation. Heart:  Regular rate and rhythm; no murmurs, rubs,  or bruits. Abdomen:  softer lax abdomen with normoactive bowel sounds. Minimal tenderness on deep palpation in the left lower quadrant. No mass and no rebound. Rectal:  soft Hemoccult negative stool. Extremities:  No clubbing, cyanosis, edema or deformities noted. Skin:  Intact without significant lesions or rashes. Psych:  Alert and  cooperative. Normal mood and affect.   Impression & Recommendations:  Problem # 1:  DIVERTICULITIS OF COLON (ICD-562.11) Patient is status post episode of diverticulitis. She is now 90% improved symptomatically. She has completed her antibiotics. She will slowly increase her fiber intake. She will continue on a probiotic and Benefiber. She is due for a colonoscopy at this time.  Problem # 2:  GERD (ICD-530.81) Patient is currently asymptomatic.  Problem # 3:  COLONIC POLYPS, HYPERPLASTIC, HX OF (ICD-V12.72) Patient had an adenomatous polyp in 2007. She has a family history of colon cancer in patient's mother. She is due for a repeat colonoscopy in June 2012.  Patient Instructions: 1)  recall colonoscopy June 2012. We will send a reminder letter in the mail. 2)  Increase fiber intake. 3)  Restart Benefiber 1 teaspoon daily and probiotic One-A-Day. 4)  Copy sent to : Dr C.Walsh 5)  The medication list was reviewed and reconciled.  All changed / newly prescribed medications were explained.  A complete medication list was provided to the patient / caregiver.

## 2011-01-05 NOTE — Assessment & Plan Note (Signed)
[facility]                         GASTROENTEROLOGY OFFICE NOTE   NAME:Debbie Morris                      MRN:          [id number]  DATE:12/12/2006                            DOB:          [birth year]    Debbie Morris is a very nice 77 year old white female who we are seeing  today in followup of diarrheal illness.  After taking course of Levaquin  for bronchitis, the patient developed diarrhea.  She has a history of  pseudomembranous colitis in May 1985 for which she required  hospitalization. This time, several weeks after finishing her Levaquin,  she started having diarrhea which was malodorous and associated with  rectal burning and irritation.  We have sent her Flagyl 250 mg three  times a day which she has taken now for 1 week with complete resolution  of all her symptoms.  The rectal irritation also has improved after  taking sitz baths and Anusol ointment with cream.   PHYSICAL EXAMINATION:  VITAL SIGNS:  Blood pressure 120/68, pulse 80,  weight 145 pounds.  GENERAL:  She was alert, oriented, no distress.  LUNGS:  Clear to auscultation.  CORONARY:  With normal S1, normal S2.  ABDOMEN:  Soft with somewhat hyperactive bowel sounds and minimal  tenderness in left lower quadrant.  No palpable mass, no distention.  RECTAL:  Shows external hemorrhoidal tag, normal rectal tone, yellow  hemoccult-negative stool.   IMPRESSION:  A 77 year old white female with acute diarrhea following a  course of antibiotic consistent with antibiotic-related colitis.  She  has responded to Flagyl so that today we do not need to pursue the  diagnostic work up  any further.   PLAN:  We will continue Flagyl 250 p.o. t.i.d. for an additional week,  then taper to 250 p.o. b.i.d. for another week, and 250 mg p.o. daily  for another week.  She will let us know if her condition recurs.  1. The patient will continue her Align and also her Anusol      suppositories and  cream as needed for rectal irritation.  We have      also told her to be careful with the Levaquin which she usually      uses for bronchitis.  This is the second time she has had      pseudomembranous colitis and she should probably stay away from      Levaquin if there is another choice of an antibiotic.     Hedwig Morton. Juanda Chance, MD  Electronically Signed    DMB/MedQ  DD: 12/12/2006  DT: 12/12/2006  Job #: [id number]   cc:   Ladell Pier, M.D.

## 2011-02-02 ENCOUNTER — Encounter: Payer: Self-pay | Admitting: Internal Medicine

## 2011-03-16 ENCOUNTER — Encounter: Payer: Self-pay | Admitting: Internal Medicine

## 2011-03-23 ENCOUNTER — Ambulatory Visit (AMBULATORY_SURGERY_CENTER): Payer: Medicare Other | Admitting: *Deleted

## 2011-03-23 ENCOUNTER — Telehealth: Payer: Self-pay | Admitting: *Deleted

## 2011-03-23 DIAGNOSIS — Z1211 Encounter for screening for malignant neoplasm of colon: Secondary | ICD-10-CM

## 2011-03-23 DIAGNOSIS — Z86010 Personal history of colonic polyps: Secondary | ICD-10-CM

## 2011-03-23 MED ORDER — PEG-KCL-NACL-NASULF-NA ASC-C 100 G PO SOLR: ORAL | Status: DC

## 2011-03-23 NOTE — Telephone Encounter (Signed)
OK to have EGD/colon the same day. DB

## 2011-03-23 NOTE — Telephone Encounter (Signed)
PATIENT IS HAVING A COLONOSCOPY ON 04-06-11 AT 10:30AM. PATIENT STATES SHE IS HAVING DYSPHAGIA,"TIGHTNESS,FOOD WON'T GO DOWN",  STATES THIS HAS BEEN GOING ON FOR THE PASS 3 MONTHS. SHE SAID THIS HAPPENED AGAIN YESTERDAY. PATIENT DENIES ANY NAUSEA,VOMTING, OR HEARTBURN SYMPTOMS. PATIENT HAD ESOPHAGUS STRETCHED AT THE TIME OF LAST COLONOSCOPY(01/23/2006). PATIENT STATES SHE WOULD LIKE TO HAVE BOTH PROCEDURE DONE AT THE SAME TIME IF POSSIBLE. YOUR SCHEDULE IS FULL THIS DAY SO I DID NOTIFIED PATIENT THAT SHE WOULD HAVE TO MOVE HER APPOINTMENT DATE FOR COLONOSCOPY IF EGD IS ADDED AND PATIENT SEEMS OKAY WITH THAT.  DO YOU WANT PATIENT TO HAVE EGD AT TIME OF COLONOSCOPY? PLEASE ADVISE.

## 2011-03-26 ENCOUNTER — Encounter: Payer: Self-pay | Admitting: *Deleted

## 2011-03-26 NOTE — Telephone Encounter (Signed)
Spoke with patient and she wants to check with her sister on appointment dates and will call me back.

## 2011-03-26 NOTE — Telephone Encounter (Signed)
Scheduled patient on 04/12/11 with 1:30 PM arrival for 2:30 PM procedure. Reviewed prep time changes with patient for  Colonoscopy.

## 2011-04-06 ENCOUNTER — Other Ambulatory Visit: Payer: Medicare Other | Admitting: Internal Medicine

## 2011-04-12 ENCOUNTER — Encounter: Payer: Self-pay | Admitting: Internal Medicine

## 2011-04-12 ENCOUNTER — Ambulatory Visit (AMBULATORY_SURGERY_CENTER): Payer: Medicare Other | Admitting: Internal Medicine

## 2011-04-12 VITALS — BP 154/87 | HR 72 | Temp 98.8°F | Resp 18 | Ht 64.0 in | Wt 153.0 lb

## 2011-04-12 DIAGNOSIS — K2270 Barrett's esophagus without dysplasia: Secondary | ICD-10-CM

## 2011-04-12 DIAGNOSIS — Z86010 Personal history of colonic polyps: Secondary | ICD-10-CM

## 2011-04-12 DIAGNOSIS — K5730 Diverticulosis of large intestine without perforation or abscess without bleeding: Secondary | ICD-10-CM

## 2011-04-12 DIAGNOSIS — K219 Gastro-esophageal reflux disease without esophagitis: Secondary | ICD-10-CM

## 2011-04-12 DIAGNOSIS — K209 Esophagitis, unspecified without bleeding: Secondary | ICD-10-CM

## 2011-04-12 DIAGNOSIS — Z1211 Encounter for screening for malignant neoplasm of colon: Secondary | ICD-10-CM

## 2011-04-12 MED ORDER — SODIUM CHLORIDE 0.9 % IV SOLN: 500.0000 mL | INTRAVENOUS | Status: DC

## 2011-04-12 NOTE — Patient Instructions (Signed)
Discharge instructions reviewed with patient and care partner.  Impressions/recommendation:  Endoscopy:  Esophagitis (handout given)  Anti-reflux regimen to be followed  Colonoscopy  Diverticulosis (handout given)  High Fiber Diet (handout given)  Continue medications as you were taking prior to your procedure.

## 2011-04-13 ENCOUNTER — Telehealth: Payer: Self-pay | Admitting: *Deleted

## 2011-04-13 NOTE — Telephone Encounter (Signed)

## 2011-04-17 ENCOUNTER — Encounter: Payer: Self-pay | Admitting: Internal Medicine

## 2011-04-30 ENCOUNTER — Telehealth: Payer: Self-pay | Admitting: Internal Medicine

## 2011-04-30 NOTE — Telephone Encounter (Signed)
Spoke with patient and confirmed with her that we have not prescribed any medication for her but for her to continue as she is and we will recheck her in 2 years.

## 2013-03-30 ENCOUNTER — Encounter: Payer: Self-pay | Admitting: Internal Medicine

## 2013-07-07 ENCOUNTER — Telehealth: Payer: Self-pay | Admitting: Internal Medicine

## 2013-07-07 NOTE — Telephone Encounter (Signed)
Spoke with patient and she wants to be sure she will be sedated for the EGD. Reassured patient. She also reports reflux that is waking her up at night. This is new for her. Scheduled OV on 07/14/13 at 3:00 PM.

## 2013-07-09 ENCOUNTER — Encounter: Payer: Self-pay | Admitting: *Deleted

## 2013-07-14 ENCOUNTER — Encounter: Payer: Self-pay | Admitting: Internal Medicine

## 2013-07-14 ENCOUNTER — Ambulatory Visit (INDEPENDENT_AMBULATORY_CARE_PROVIDER_SITE_OTHER): Payer: Medicare Other | Admitting: Internal Medicine

## 2013-07-14 VITALS — BP 140/80 | HR 80 | Ht 63.0 in | Wt 158.0 lb

## 2013-07-14 DIAGNOSIS — R1319 Other dysphagia: Secondary | ICD-10-CM

## 2013-07-14 DIAGNOSIS — K2270 Barrett's esophagus without dysplasia: Secondary | ICD-10-CM

## 2013-07-14 NOTE — Patient Instructions (Addendum)
You have been scheduled for an endoscopy with propofol. Please follow written instructions given to you at your visit today. If you use inhalers (even only as needed), please bring them with you on the day of your procedure. Your physician has requested that you go to www.[email or web address].[email or web address] and enter the access code given to you at your visit today. This web site gives a general overview about your procedure. However, you should still follow specific instructions given to you by our office regarding your preparation for the procedure.  CC: Dr Elby Showers

## 2013-07-14 NOTE — Progress Notes (Signed)
Debbie Morris 01/13/1930 MRN [id number]  History of Present Illness:  This is an 79 year old white female with Barrett's esophagus diagnosed on an upper endoscopy in August 2012. Biopsies showed intestinal metaplasia. She also had esophagitis but no evidence of stricture. There is a history of an esophageal stricture and hiatal hernia on a prior endoscopy in June 2007 when she was dilated to 16 mm with Savary dilators. She is now having a recurrence of reflux. She is not taking any acid reducing medications. She has noticed recently that she has solid food dysphagia. She takes antacids when necessary. She elevates the head of the bed at night. She had pseudomembranous colitis in 1985. A colonoscopy in August 2012 showed severe diverticulosis of the left colon and a tubular adenoma. There is no plan for recall colonoscopy because of her age of 84.   Past Medical History  Diagnosis Date  . Hyperlipidemia   . Asthma   . Arthritis   . Depression   . Cataract   . COPD (chronic obstructive pulmonary disease)   . Hiatal hernia   . Stricture of esophagus   . Diverticulosis   . Barrett esophagus   . Anxiety   . Hemorrhoids   . Irritable bowel   . Pancreatitis   . Shingles 1998    Back and around to torso   Past Surgical History  Procedure Laterality Date  . Abdominal hysterectomy    . Tonsillectomy    . Cataract extraction Bilateral     reports that she quit smoking about 14 years ago. Her smoking use included Cigarettes. She smoked 0.00 packs per day. She has never used smokeless tobacco. She reports that she drinks about 1.2 ounces of alcohol per week. She reports that she does not use illicit drugs. family history includes Colon cancer in her maternal aunt and maternal aunt; Colon cancer (age of onset: 67) in her mother; Diabetes in her mother; Prostate cancer in her father; Stomach cancer (age of onset: 24) in her maternal grandmother. Allergies  Allergen Reactions  . Tetanus Toxoid    . Erythromycin Rash  . Penicillins Rash    MOUTH SWELLING        Review of Systems: Occasional solid food dysphagia. Occasional heartburn  The remainder of the 10 point ROS is negative except as outlined in H&P   Physical Exam: General appearance  Well developed, in no distress. Eyes- non icteric. HEENT nontraumatic, normocephalic. Mouth no lesions, tongue papillated, no cheilosis. Neck supple without adenopathy, thyroid not enlarged, no carotid bruits, no JVD. Lungs Clear to auscultation bilaterally. Cor normal S1, normal S2, regular rhythm, no murmur,  quiet precordium. Abdomen: Soft nontender abdomen with normoactive bowel sounds. No distention. No mass. Rectal: Not done. Extremities no pedal edema. Skin no lesions. Neurological alert and oriented x 3. Psychological normal mood and affect.  Assessment and Plan:  Problem #43 79 year old white female with Barrett's esophagus who is due for a repeat upper endoscopy for followup of metaplasia. This will be scheduled at her convenience. She needs to follow antireflux measures. Depending on the results of the endoscopy, she will have to start back on PPIs.  Problem #2 Colorectal screening. Patient had a tubular adenoma in August 2012. There is no recall planned due to age.   07/14/2013 Lina Sar

## 2013-09-25 ENCOUNTER — Encounter: Payer: Self-pay | Admitting: Internal Medicine

## 2013-09-25 ENCOUNTER — Ambulatory Visit (AMBULATORY_SURGERY_CENTER): Payer: Medicare Other | Admitting: Internal Medicine

## 2013-09-25 VITALS — BP 144/66 | HR 73 | Temp 97.8°F | Resp 16 | Ht 63.0 in | Wt 158.0 lb

## 2013-09-25 DIAGNOSIS — K2100 Gastro-esophageal reflux disease with esophagitis, without bleeding: Secondary | ICD-10-CM

## 2013-09-25 DIAGNOSIS — K2270 Barrett's esophagus without dysplasia: Secondary | ICD-10-CM

## 2013-09-25 DIAGNOSIS — D130 Benign neoplasm of esophagus: Secondary | ICD-10-CM

## 2013-09-25 MED ORDER — OMEPRAZOLE 40 MG PO CPDR: 40.0000 mg | DELAYED_RELEASE_CAPSULE | Freq: Every day | ORAL | Status: DC

## 2013-09-25 MED ORDER — SODIUM CHLORIDE 0.9 % IV SOLN: 500.0000 mL | INTRAVENOUS | Status: DC

## 2013-09-25 NOTE — Progress Notes (Signed)
Called to room to assist during endoscopic procedure.  Patient ID and intended procedure confirmed with present staff. Received instructions for my participation in the procedure from the performing physician.  

## 2013-09-25 NOTE — Patient Instructions (Signed)
YOU HAD AN ENDOSCOPIC PROCEDURE TODAY AT THE [facility]: Refer to the procedure report that was given to you for any specific questions about what was found during the examination.  If the procedure report does not answer your questions, please call your gastroenterologist to clarify.  If you requested that your care partner not be given the details of your procedure findings, then the procedure report has been included in a sealed envelope for you to review at your convenience later.  YOU SHOULD EXPECT: Some feelings of bloating in the abdomen. Passage of more gas than usual.  Walking can help get rid of the air that was put into your GI tract during the procedure and reduce the bloating. If you had a lower endoscopy (such as a colonoscopy or flexible sigmoidoscopy) you may notice spotting of blood in your stool or on the toilet paper. If you underwent a bowel prep for your procedure, then you may not have a normal bowel movement for a few days.  DIET: Your first meal following the procedure should be a light meal and then it is ok to progress to your normal diet.  A half-sandwich or bowl of soup is an example of a good first meal.  Heavy or fried foods are harder to digest and may make you feel nauseous or bloated.  Likewise meals heavy in dairy and vegetables can cause extra gas to form and this can also increase the bloating.  Drink plenty of fluids but you should avoid alcoholic beverages for 24 hours.  ACTIVITY: Your care partner should take you home directly after the procedure.  You should plan to take it easy, moving slowly for the rest of the day.  You can resume normal activity the day after the procedure however you should NOT DRIVE or use heavy machinery for 24 hours (because of the sedation medicines used during the test).    SYMPTOMS TO REPORT IMMEDIATELY: A gastroenterologist can be reached at any hour.  During normal business hours, 8:30 AM to 5:00 PM Monday through Friday,  call [phone].  After hours and on weekends, please call the GI answering service at [phone] who will take a message and have the physician on call contact you.   Following lower endoscopy (colonoscopy or flexible sigmoidoscopy):  Excessive amounts of blood in the stool  Significant tenderness or worsening of abdominal pains  Swelling of the abdomen that is new, acute  Fever of 100F or higher  Following upper endoscopy (EGD)  Vomiting of blood or coffee ground material  New chest pain or pain under the shoulder blades  Painful or persistently difficult swallowing  New shortness of breath  Fever of 100F or higher  Black, tarry-looking stools  FOLLOW UP: If any biopsies were taken you will be contacted by phone or by letter within the next 1-3 weeks.  Call your gastroenterologist if you have not heard about the biopsies in 3 weeks.  Our staff will call the home number listed on your records the next business day following your procedure to check on you and address any questions or concerns that you may have at that time regarding the information given to you following your procedure. This is a courtesy call and so if there is no answer at the home number and we have not heard from you through the emergency physician on call, we will assume that you have returned to your regular daily activities without incident.  SIGNATURES/CONFIDENTIALITY: You and/or your care   partner have signed paperwork which will be entered into your electronic medical record.  These signatures attest to the fact that that the information above on your After Visit Summary has been reviewed and is understood.  Full responsibility of the confidentiality of this discharge information lies with you and/or your care-partner.YOU HAD AN ENDOSCOPIC PROCEDURE TODAY AT [facility]: Refer to the procedure report that was given to you for any specific questions about what was found during the examination.   If the procedure report does not answer your questions, please call your gastroenterologist to clarify.  If you requested that your care partner not be given the details of your procedure findings, then the procedure report has been included in a sealed envelope for you to review at your convenience later.  YOU SHOULD EXPECT: Some feelings of bloating in the abdomen. Passage of more gas than usual.  Walking can help get rid of the air that was put into your GI tract during the procedure and reduce the bloating. If you had a lower endoscopy (such as a colonoscopy or flexible sigmoidoscopy) you may notice spotting of blood in your stool or on the toilet paper. If you underwent a bowel prep for your procedure, then you may not have a normal bowel movement for a few days.  DIET: Your first meal following the procedure should be a light meal and then it is ok to progress to your normal diet.  A half-sandwich or bowl of soup is an example of a good first meal.  Heavy or fried foods are harder to digest and may make you feel nauseous or bloated.  Likewise meals heavy in dairy and vegetables can cause extra gas to form and this can also increase the bloating.  Drink plenty of fluids but you should avoid alcoholic beverages for 24 hours.  ACTIVITY: Your care partner should take you home directly after the procedure.  You should plan to take it easy, moving slowly for the rest of the day.  You can resume normal activity the day after the procedure however you should NOT DRIVE or use heavy machinery for 24 hours (because of the sedation medicines used during the test).    SYMPTOMS TO REPORT IMMEDIATELY: A gastroenterologist can be reached at any hour.  During normal business hours, 8:30 AM to 5:00 PM Monday through Friday, call [phone].  After hours and on weekends, please call the GI answering service at [phone] who will take a message and have the physician on call contact you.   Following upper  endoscopy (EGD)  Vomiting of blood or coffee ground material  New chest pain or pain under the shoulder blades  Painful or persistently difficult swallowing  New shortness of breath  Fever of 100F or higher  Black, tarry-looking stools  FOLLOW UP: If any biopsies were taken you will be contacted by phone or by letter within the next 1-3 weeks.  Call your gastroenterologist if you have not heard about the biopsies in 3 weeks.  Our staff will call the home number listed on your records the next business day following your procedure to check on you and address any questions or concerns that you may have at that time regarding the information given to you following your procedure. This is a courtesy call and so if there is no answer at the home number and we have not heard from you through the emergency physician on call, we will assume that you have returned  to your regular daily activities without incident.  SIGNATURES/CONFIDENTIALITY: You and/or your care partner have signed paperwork which will be entered into your electronic medical record.  These signatures attest to the fact that that the information above on your After Visit Summary has been reviewed and is understood.  Full responsibility of the confidentiality of this discharge information lies with you and/or your care-partner.

## 2013-09-25 NOTE — Op Note (Signed)
[facility]  [address] [place], [address]   ENDOSCOPY PROCEDURE REPORT  PATIENT: Debbie, Morris  MR#: [id number] BIRTHDATE: 06-17-1930 , 83  yrs. old GENDER: Female ENDOSCOPIST: Lafayette Dragon, MD REFERRED BY:  Myrtis Ser, M.D. PROCEDURE DATE:  09/25/2013 PROCEDURE:  EGD w/ biopsy ASA CLASS:     Class II INDICATIONS:  history of Barrett's esophagus.   last upper endoscopy August 2012 showed Barrett's esophagus.  History of esophageal stricture dilated in 2007.  Recurrent dysphagia. MEDICATIONS: MAC sedation, administered by CRNA and Propofol (Diprivan) 70 mg IV TOPICAL ANESTHETIC: none  DESCRIPTION OF PROCEDURE: After the risks benefits and alternatives of the procedure were thoroughly explained, informed consent was obtained.  The LB [id number] endoscope was introduced through the mouth and advanced to the second portion of the duodenum. Without limitations.  The instrument was slowly withdrawn as the mucosa was fully examined.      Esophagus, esophageal mucosa appeared normal in proximal and midesophagus. Lumen was torturous. There were no retained secretions. There was a 10 mm long erosion at the GE junction consistent with grade a esophagitis. The Z line was normal. Biopsies were taken to rule out Barrett's esophagus Stomach: There was the 2 cm reducible hiatal hernia extending from 37-39 cm from the incisors. Gastric mucosa was mildly erythematous but there were no erosions no sign of bleeding. Gastric outlet was normal. Retroflexion of the endoscope on for a presence of hiatal herniaDuodenum duodenal bulb and descending duodenum was normal[         The scope was then withdrawn from the patient and the procedure completed.  COMPLICATIONS: There were no complications. ENDOSCOPIC IMPRESSION:  grade 1 reflux esophagitis. Status post biopsies to rule out Barrett's esophagus Minimal gastritis 2 cm reducible hiatal hernia No  significant stricture  RECOMMENDATIONS: 1.  Await pathology results 2.  Anti-reflux regimen to be follow 3.  Prilosec 20 mg by mouth each bedtime  REPEAT EXAM: for EGD pending biopsy results.  eSigned:  Lafayette Dragon, MD 09/25/2013 10:45 AM   CC:  PATIENT NAME:  Debbie, Morris MR#: [id number]

## 2013-09-25 NOTE — Progress Notes (Signed)
Stable to RR 

## 2013-09-28 ENCOUNTER — Telehealth: Payer: Self-pay | Admitting: *Deleted

## 2013-09-28 NOTE — Telephone Encounter (Signed)
  Follow up Call-  Call back number 09/25/2013 04/12/2011  Post procedure Call Back phone  # [phone]  Permission to leave phone message Yes -     Patient questions:  Message left to call us if necessary.

## 2013-09-29 ENCOUNTER — Encounter: Payer: Self-pay | Admitting: Internal Medicine

## 2013-11-11 ENCOUNTER — Telehealth: Payer: Self-pay | Admitting: Internal Medicine

## 2013-11-11 ENCOUNTER — Encounter: Payer: Self-pay | Admitting: Nurse Practitioner

## 2013-11-11 ENCOUNTER — Ambulatory Visit (INDEPENDENT_AMBULATORY_CARE_PROVIDER_SITE_OTHER): Payer: Medicare Other | Admitting: Nurse Practitioner

## 2013-11-11 ENCOUNTER — Other Ambulatory Visit (INDEPENDENT_AMBULATORY_CARE_PROVIDER_SITE_OTHER): Payer: Medicare Other

## 2013-11-11 VITALS — BP 116/70 | HR 88 | Ht 63.0 in | Wt 160.6 lb

## 2013-11-11 DIAGNOSIS — K5732 Diverticulitis of large intestine without perforation or abscess without bleeding: Secondary | ICD-10-CM

## 2013-11-11 DIAGNOSIS — R1032 Left lower quadrant pain: Secondary | ICD-10-CM

## 2013-11-11 LAB — CBC WITH DIFFERENTIAL/PLATELET
Basophils Absolute: 0.1 10*3/uL (ref 0.0–0.1)
Basophils Relative: 0.6 % (ref 0.0–3.0)
Eosinophils Absolute: 0.1 10*3/uL (ref 0.0–0.7)
Eosinophils Relative: 1.4 % (ref 0.0–5.0)
HCT: 39.9 % (ref 36.0–46.0)
Hemoglobin: 13.4 g/dL (ref 12.0–15.0)
Lymphocytes Relative: 17.3 % (ref 12.0–46.0)
Lymphs Abs: 1.8 10*3/uL (ref 0.7–4.0)
MCHC: 33.5 g/dL (ref 30.0–36.0)
MCV: 88.6 fl (ref 78.0–100.0)
Monocytes Absolute: 1 10*3/uL (ref 0.1–1.0)
Monocytes Relative: 9.9 % (ref 3.0–12.0)
Neutro Abs: 7.3 10*3/uL (ref 1.4–7.7)
Neutrophils Relative %: 70.8 % (ref 43.0–77.0)
Platelets: 253 10*3/uL (ref 150.0–400.0)
RBC: 4.51 Mil/uL (ref 3.87–5.11)
RDW: 14.5 % (ref 11.5–14.6)
WBC: 10.3 10*3/uL (ref 4.5–10.5)

## 2013-11-11 LAB — URINALYSIS
Bilirubin Urine: NEGATIVE
Ketones, ur: NEGATIVE
Leukocytes, UA: NEGATIVE
Nitrite: NEGATIVE
Specific Gravity, Urine: 1.025 (ref 1.000–1.030)
Urine Glucose: NEGATIVE
Urobilinogen, UA: 0.2 (ref 0.0–1.0)
pH: 5.5 (ref 5.0–8.0)

## 2013-11-11 LAB — BASIC METABOLIC PANEL
BUN: 19 mg/dL (ref 6–23)
CO2: 28 mEq/L (ref 19–32)
Calcium: 9.3 mg/dL (ref 8.4–10.5)
Chloride: 101 mEq/L (ref 96–112)
Creatinine, Ser: 1 mg/dL (ref 0.4–1.2)
GFR: 57.14 mL/min — ABNORMAL LOW (ref >60.00)
Glucose, Bld: 99 mg/dL (ref 70–99)
Potassium: 4.1 mEq/L (ref 3.5–5.1)
Sodium: 138 mEq/L (ref 135–145)

## 2013-11-11 MED ORDER — METRONIDAZOLE 500 MG PO TABS: 500.0000 mg | ORAL_TABLET | Freq: Three times a day (TID) | ORAL | Status: AC

## 2013-11-11 MED ORDER — CIPROFLOXACIN HCL 500 MG PO TABS: 500.0000 mg | ORAL_TABLET | Freq: Two times a day (BID) | ORAL | Status: AC

## 2013-11-11 MED ORDER — DICYCLOMINE HCL 10 MG PO CAPS: ORAL_CAPSULE | ORAL | Status: DC

## 2013-11-11 NOTE — Patient Instructions (Signed)
Please go to the basement level to our lab for a urine specimen. We sent prescriptions to [facility]. Cipro and Flagyl Bentyl  Stay on a clear liquid diet until you feel better. Call us next week for an appointment with either [facility] or [facility].

## 2013-11-11 NOTE — Telephone Encounter (Signed)
Spoke with patient and she states she has had abdominal discomfort for several days that is worse. States the crampy like pain is below the belly button. The pain is less after she has a bowel movement and if she is laying down. She also reports a change in shape of her stools for the last few days. Scheduled with Tye Savoy, NP [place] at 3:00 PM.

## 2013-11-11 NOTE — Progress Notes (Signed)
     History of Present Illness:  Patient is an 80 year old female known to Dr. Olevia Perches for history of GERD/Barrett's esophagus , adenomatous colon polyps , and severe diverticulosis. Patient is here for a several day history of of lower abdominal pain . The pain is worse with defecation, her stools are thinner than normal. She had an expired bottle of Bentyl at home but scared to take it.     Current Medications, Allergies, Past Medical History, Past Surgical History, Family History and Social History were reviewed in [employer] [employer] record.  Physical Exam: General: Pleasant, well developed , white female in no acute distress Head: Normocephalic and atraumatic Eyes:  sclerae anicteric, conjunctiva pink  Ears: Normal auditory acuity Lungs: Clear throughout to auscultation Heart: Regular rate and rhythm Abdomen: Soft, non distended, moderate LLQ tenderness.  No masses, no hepatomegaly. Normal bowel sounds Musculoskeletal: Symmetrical with no gross deformities  Extremities: No edema  Neurological: Alert oriented x 4, grossly nonfocal Psychological:  Alert and cooperative. Normal mood and affect  Assessment and Recommendations: 80 year old female with several day history of lower abdominal pain.Stools thinner in caliber. She is moderately tender in LLQ on exam today. This could be IBS but favor diverticulitis. Will treat empirically with cipro and flagyl. Refill Bentyl. Check a urinalysis. Clear liquid diet over next couple of days. Will call patient for a condition update in a few days. She knows to call in the interim for worsening pain and / or fevers.

## 2013-11-13 ENCOUNTER — Encounter: Payer: Self-pay | Admitting: Nurse Practitioner

## 2013-11-13 NOTE — Progress Notes (Signed)
Reviewed and agree.

## 2013-11-16 ENCOUNTER — Other Ambulatory Visit: Payer: Self-pay | Admitting: *Deleted

## 2013-11-16 ENCOUNTER — Other Ambulatory Visit (INDEPENDENT_AMBULATORY_CARE_PROVIDER_SITE_OTHER): Payer: Medicare Other

## 2013-11-16 DIAGNOSIS — R195 Other fecal abnormalities: Secondary | ICD-10-CM

## 2013-11-16 LAB — BASIC METABOLIC PANEL
BUN: 11 mg/dL (ref 6–23)
CO2: 29 mEq/L (ref 19–32)
Calcium: 9.1 mg/dL (ref 8.4–10.5)
Chloride: 102 mEq/L (ref 96–112)
Creatinine, Ser: 0.9 mg/dL (ref 0.4–1.2)
GFR: 60.66 mL/min (ref >60.00)
Glucose, Bld: 101 mg/dL — ABNORMAL HIGH (ref 70–99)
Potassium: 3.9 mEq/L (ref 3.5–5.1)
Sodium: 138 mEq/L (ref 135–145)

## 2013-11-17 ENCOUNTER — Telehealth: Payer: Self-pay | Admitting: *Deleted

## 2013-11-17 LAB — CBC WITH DIFFERENTIAL/PLATELET
Basophils Absolute: 0 10*3/uL (ref 0.0–0.1)
Basophils Relative: 0.5 % (ref 0.0–3.0)
Eosinophils Absolute: 0.1 10*3/uL (ref 0.0–0.7)
Eosinophils Relative: 2 % (ref 0.0–5.0)
HCT: 40.1 % (ref 36.0–46.0)
Hemoglobin: 13.3 g/dL (ref 12.0–15.0)
Lymphocytes Relative: 27.5 % (ref 12.0–46.0)
Lymphs Abs: 1.7 10*3/uL (ref 0.7–4.0)
MCHC: 33 g/dL (ref 30.0–36.0)
MCV: 89.8 fl (ref 78.0–100.0)
Monocytes Absolute: 0.6 10*3/uL (ref 0.1–1.0)
Monocytes Relative: 10.1 % (ref 3.0–12.0)
Neutro Abs: 3.7 10*3/uL (ref 1.4–7.7)
Neutrophils Relative %: 59.9 % (ref 43.0–77.0)
Platelets: 283 10*3/uL (ref 150.0–400.0)
RBC: 4.47 Mil/uL (ref 3.87–5.11)
RDW: 13.6 % (ref 11.5–14.6)
WBC: 6.1 10*3/uL (ref 4.5–10.5)

## 2013-11-17 NOTE — Telephone Encounter (Signed)
Message copied by Hulan Saas on Tue Nov 17, 2013  4:04 PM ------      Message from: Hulan Saas      Created: Mon Nov 16, 2013 10:18 AM       Did patient get labs for PG ------

## 2013-11-17 NOTE — Telephone Encounter (Signed)
Per Nevin Bloodgood, CBC looks good not bleeding. Patient notified.

## 2014-05-03 ENCOUNTER — Encounter: Payer: Self-pay | Admitting: Internal Medicine

## 2014-05-12 ENCOUNTER — Telehealth: Payer: Self-pay | Admitting: Internal Medicine

## 2014-05-12 NOTE — Telephone Encounter (Signed)
On Sunday, felt bad. On Monday, started having cramping, LLQ pain and mucous in stool. She is taking Bentyl TID. Denies bleeding, nausea, vomiting or fever. She will start on clear liquids. Hx diverticulitis. She thinks it is the usual symptoms of diverticulitis. Please, advise.

## 2014-05-12 NOTE — Telephone Encounter (Signed)
Left a message for patient to call back. 

## 2014-05-12 NOTE — Telephone Encounter (Signed)
Please start Cipro 500 mg, po bid, #14, Flagyl 250 mg, #21,1 po tid. Agree on liquid diet till abdomen improved.

## 2014-05-13 ENCOUNTER — Other Ambulatory Visit: Payer: Self-pay | Admitting: *Deleted

## 2014-05-13 MED ORDER — METRONIDAZOLE 250 MG PO TABS: ORAL_TABLET | ORAL | Status: DC

## 2014-05-13 MED ORDER — CIPROFLOXACIN HCL 500 MG PO TABS
ORAL_TABLET | ORAL | Status: DC
Start: 2014-05-13 — End: 2016-02-17

## 2014-05-13 NOTE — Telephone Encounter (Signed)
Spoke with patient and gave her recommendations. Rx's sent.  

## 2014-05-14 ENCOUNTER — Telehealth: Payer: Self-pay | Admitting: Internal Medicine

## 2014-05-14 NOTE — Telephone Encounter (Signed)
Spoke with patient and she will take her probiotic.

## 2014-05-20 ENCOUNTER — Other Ambulatory Visit: Payer: Self-pay | Admitting: Obstetrics and Gynecology

## 2014-05-20 DIAGNOSIS — M810 Age-related osteoporosis without current pathological fracture: Secondary | ICD-10-CM

## 2014-08-18 ENCOUNTER — Ambulatory Visit
Admission: RE | Admit: 2014-08-18 | Discharge: 2014-08-18 | Disposition: A | Discharge status: Home / Self Care | Payer: Medicare Other | Source: Ambulatory Visit | Attending: Internal Medicine | Admitting: Internal Medicine

## 2014-08-18 ENCOUNTER — Other Ambulatory Visit: Payer: Self-pay | Admitting: Internal Medicine

## 2014-08-18 DIAGNOSIS — R0609 Other forms of dyspnea: Principal | ICD-10-CM

## 2014-08-18 DIAGNOSIS — R0600 Dyspnea, unspecified: Secondary | ICD-10-CM

## 2014-09-09 ENCOUNTER — Ambulatory Visit (INDEPENDENT_AMBULATORY_CARE_PROVIDER_SITE_OTHER): Payer: Medicare Other | Admitting: Cardiovascular Disease

## 2014-09-09 ENCOUNTER — Encounter: Payer: Self-pay | Admitting: Cardiovascular Disease

## 2014-09-09 VITALS — BP 138/66 | HR 79 | Ht 63.0 in | Wt 160.0 lb

## 2014-09-09 DIAGNOSIS — R0600 Dyspnea, unspecified: Secondary | ICD-10-CM

## 2014-09-09 DIAGNOSIS — K222 Esophageal obstruction: Secondary | ICD-10-CM

## 2014-09-09 NOTE — Patient Instructions (Signed)

## 2014-09-09 NOTE — Progress Notes (Signed)
Patient ID: Debbie Morris, female   DOB: 07-16-1930, 81 y.o.   MRN: [id number]    Cardiology Office Note   Date:  09/09/2014   ID:  Debbie Morris, DOB [birth year], MRN [id number]  PCP:  Myrtis Ser, MD  Cardiologist:   Jenkins Rouge, MD   No chief complaint on file.     History of Present Illness: Debbie Morris is a 81 y.o. female who presents for dyspnea  She has a history of asthma.  SOB for about a month.  At rest and with exertion  Cant do what she use to.  No chest pain , LE edema cough fever or sputum.  CXR done 08/18/14 with some air trapping but no acute issues.  Told to use Qvar regularly with some help but feels she still gets winded. No previous cardiac issues.  CRF;s elevated lipids  Had flu shot.  No pleuritic pain or calf pain.  No history of anemia  Reviewed labs from 12/30 [facility]  Hct 41.5  TSH 1.91  LDL 128  WBC 5.4     Past Medical History  Diagnosis Date  . Hyperlipidemia   . Asthma   . Arthritis   . Depression   . Cataract   . COPD (chronic obstructive pulmonary disease)   . Hiatal hernia   . Stricture of esophagus   . Diverticulosis   . Barrett esophagus   . Anxiety   . Hemorrhoids   . Irritable bowel   . Pancreatitis   . Shingles 1998    Back and around to torso    Past Surgical History  Procedure Laterality Date  . Abdominal hysterectomy    . Tonsillectomy    . Cataract extraction Bilateral      Current Outpatient Prescriptions  Medication Sig Dispense Refill  . aspirin 81 MG tablet Take 81 mg by mouth every evening.      . Calcium Carbonate (SUPER CALCIUM) 1500 MG TABS Take 1 tablet by mouth 2 (two) times daily.      . Cholecalciferol (VITAMIN D-3 PO) Take 2,000 Units by mouth 2 (two) times daily.     . ciprofloxacin (CIPRO) 500 MG tablet Take one po BID (Patient taking differently: Take 500 mg by mouth as needed. Take one po BID) 14 tablet 0  . citalopram (CELEXA) 10 MG tablet Take 1 tablet by mouth Daily.    Marland Kitchen dicyclomine (BENTYL)  10 MG capsule Take 1 tab 3 times daily as needed for cramps and spasms. 90 capsule 0  . metroNIDAZOLE (FLAGYL) 250 MG tablet Take one po TID 21 tablet 0  . Misc Natural Products (OSTEO BI-FLEX TRIPLE STRENGTH PO) Take 1 tablet by mouth 2 (two) times daily.      . Multiple Vitamins-Minerals (CENTRUM SILVER PO) Take 1 tablet by mouth every evening.      Marland Kitchen omeprazole (PRILOSEC) 40 MG capsule Take 1 capsule (40 mg total) by mouth daily. 90 capsule 3  . Probiotic Product ([employer] SENIOR PROBIOTIC PO) Take 1 tablet by mouth every evening.      . vitamin B-12 (CYANOCOBALAMIN) 1000 MCG tablet Take 5,000 mcg by mouth daily.      No current facility-administered medications for this visit.    Allergies:   Bee venom; Tetanus toxoid; Erythromycin; and Penicillins    Social History:  The patient  reports that she quit smoking about 16 years ago. Her smoking use included Cigarettes. She has never used smokeless tobacco. She reports that she drinks about  1.2 oz of alcohol per week. She reports that she does not use illicit drugs.   Family History:  The patient's family history includes Colon cancer in her maternal aunt and maternal aunt; Colon cancer (age of onset: 69) in her mother; Diabetes in her mother; Prostate cancer in her father; Stomach cancer (age of onset: 24) in her maternal grandmother.    ROS:  Please see the history of present illness.   Otherwise, review of systems are positive for none.   All other systems are reviewed and negative.    PHYSICAL EXAM: VS:  Ht 5\' 3"  (1.6 m)  Wt 72.576 kg (160 lb)  BMI 28.35 kg/m2 , BMI Body mass index is 28.35 kg/(m^2). GEN: Well nourished, well developed, in no acute distress HEENT: normal Neck: no JVD, carotid bruits, or masses Cardiac: RRR; no murmurs, rubs, or gallops,no edema  Respiratory:  clear to auscultation bilaterally, normal work of breathing GI: soft, nontender, nondistended, + BS MS: no deformity or atrophy Skin: warm and dry, no  rash Neuro:  Strength and sensation are intact Psych: euthymic mood, full affect   EKG:  EKG is ordered today. The ekg ordered today demonstrates  SR rate 79 short PR sinus arrhythmia nonspecifc ST changes minor change from 12/30 SR rate 67 normal ECG    Recent Labs: 11/16/2013: BUN 11; Creatinine 0.9; Hemoglobin 13.3; Platelets 283.0; Potassium 3.9; Sodium 138    Lipid Panel No results found for: CHOL, TRIG, HDL, CHOLHDL, VLDL, LDLCALC, LDLDIRECT    Wt Readings from Last 3 Encounters:  09/09/14 72.576 kg (160 lb)  11/11/13 72.848 kg (160 lb 9.6 oz)  09/25/13 71.668 kg (158 lb)      Other studies Reviewed: Additional studies/ records that were reviewed today include: [facility] visit from [facility] 12/30. Review of the above records demonstrates:  Normal labs and ECG   ASSESSMENT AND PLAN:  1.  Dyspnea:  Not likely cardiac given no history , normal exam and ECG  F/U echo to assess RV and LV function 2. Elevated lipids:  At target for elderly female with no vascular disease 3.  Asthma  Continue Qvar no active wheezing consider PFTls if echo shows normal RV/LV function  4. Anxiety:  Continue Celexa may be contributing to dyspnea sensation    Current medicines are reviewed at length with the patient today.  The patient does not have concerns regarding medicines.  The following changes have been made:  no change  Labs/ tests ordered today include: Echo   No orders of the defined types were placed in this encounter.     Disposition:   FU with PRN     Signed, Jenkins Rouge, MD  09/09/2014 2:55 PM    [address], [place], [place]  [address] Phone: [phone]; Fax: [phone]

## 2014-09-13 ENCOUNTER — Ambulatory Visit (HOSPITAL_COMMUNITY): Payer: Medicare Other | Attending: Cardiovascular Disease | Admitting: Radiology

## 2014-09-13 DIAGNOSIS — R0600 Dyspnea, unspecified: Secondary | ICD-10-CM | POA: Diagnosis not present

## 2014-09-13 NOTE — Progress Notes (Signed)
Echocardiogram performed.  

## 2016-02-09 ENCOUNTER — Emergency Department (HOSPITAL_COMMUNITY)
Admission: EM | Admit: 2016-02-09 | Discharge: 2016-02-09 | Disposition: A | Discharge status: Home / Self Care | Payer: Medicare Other | Attending: Emergency Medicine | Admitting: Emergency Medicine

## 2016-02-09 ENCOUNTER — Encounter (HOSPITAL_COMMUNITY): Payer: Self-pay | Admitting: Nurse Practitioner

## 2016-02-09 DIAGNOSIS — Z7982 Long term (current) use of aspirin: Secondary | ICD-10-CM | POA: Insufficient documentation

## 2016-02-09 DIAGNOSIS — T7840XA Allergy, unspecified, initial encounter: Secondary | ICD-10-CM | POA: Diagnosis not present

## 2016-02-09 DIAGNOSIS — J449 Chronic obstructive pulmonary disease, unspecified: Secondary | ICD-10-CM | POA: Insufficient documentation

## 2016-02-09 DIAGNOSIS — Z87891 Personal history of nicotine dependence: Secondary | ICD-10-CM | POA: Insufficient documentation

## 2016-02-09 DIAGNOSIS — Z79899 Other long term (current) drug therapy: Secondary | ICD-10-CM | POA: Diagnosis not present

## 2016-02-09 DIAGNOSIS — R220 Localized swelling, mass and lump, head: Secondary | ICD-10-CM | POA: Insufficient documentation

## 2016-02-09 MED ORDER — SODIUM CHLORIDE 0.9 % IV SOLN: Freq: Once | INTRAVENOUS | Status: DC

## 2016-02-09 MED ORDER — FAMOTIDINE IN NACL 20-0.9 MG/50ML-% IV SOLN
20.0000 mg | Freq: Once | INTRAVENOUS | Status: AC
  Administered 2016-02-09: 20 mg via INTRAVENOUS
  Filled 2016-02-09: qty 50

## 2016-02-09 MED ORDER — CETIRIZINE HCL 10 MG PO TABS: 10.0000 mg | ORAL_TABLET | Freq: Every day | ORAL | Status: DC

## 2016-02-09 MED ORDER — DIPHENHYDRAMINE HCL 50 MG/ML IJ SOLN
25.0000 mg | Freq: Once | INTRAMUSCULAR | Status: AC
  Administered 2016-02-09: 25 mg via INTRAVENOUS
  Filled 2016-02-09: qty 1

## 2016-02-09 MED ORDER — SODIUM CHLORIDE 0.9 % IV BOLUS (SEPSIS): 500.0000 mL | Freq: Once | INTRAVENOUS | Status: DC

## 2016-02-09 MED ORDER — METHYLPREDNISOLONE SODIUM SUCC 125 MG IJ SOLR
125.0000 mg | Freq: Once | INTRAMUSCULAR | Status: AC
  Administered 2016-02-09: 125 mg via INTRAVENOUS
  Filled 2016-02-09: qty 2

## 2016-02-09 MED ORDER — PREDNISONE 20 MG PO TABS: 20.0000 mg | ORAL_TABLET | Freq: Two times a day (BID) | ORAL | Status: DC

## 2016-02-09 NOTE — ED Notes (Signed)
She woke this morning with facial itching and swelling, lip swelling. She applied benadryl cream and took benadryl but symptoms have worsened. She denies trouble breathing and is able to speak in full unlabored sentences.  She denies using any new medications, products or foods.

## 2016-02-09 NOTE — Discharge Instructions (Signed)
Do not eat any additional of the peanut butter crackers.  Return here with any worsening of your symptoms.  Prednisone, Zyrtec for the next 2 days.

## 2016-02-09 NOTE — ED Provider Notes (Signed)
CSN: [id number]     Arrival date & time 02/09/16  1117 History   First MD Initiated Contact with Patient 02/09/16 1158     Chief Complaint  Patient presents with  . Allergic Reaction      HPI  Patient presents for evaluation of some facial swelling. States she taken this morning and noticed some itching in the left side of her face. States she has had some bug bites on her legs but no other areas of itching or additional rash. Does not take an ARB or ACE inhibitor. In fact is on no blood pressure medicines. She ate some different peanut butter and crackers yesterday.  No difficulty breathing. Her tongue and pharynx appear normal. No tightness in the throat.  Past Medical History  Diagnosis Date  . Hyperlipidemia   . Asthma   . Arthritis   . Depression   . Cataract   . COPD (chronic obstructive pulmonary disease) ([place])   . Hiatal hernia   . Stricture of esophagus   . Diverticulosis   . Barrett esophagus   . Anxiety   . Hemorrhoids   . Irritable bowel   . Pancreatitis   . Shingles 1998    Back and around to torso   Past Surgical History  Procedure Laterality Date  . Abdominal hysterectomy    . Tonsillectomy    . Cataract extraction Bilateral    Family History  Problem Relation Age of Onset  . Colon cancer Mother 26  . Prostate cancer Father   . Colon cancer Maternal Aunt   . Stomach cancer Maternal Grandmother 89  . Colon cancer Maternal Aunt   . Diabetes Mother    Social History  Substance Use Topics  . Smoking status: Former Smoker    Types: Cigarettes    Quit date: 08/20/1998  . Smokeless tobacco: Never Used  . Alcohol Use: 1.2 oz/week    2 Glasses of wine per week   OB History    No data available     Review of Systems  Constitutional: Negative for fever, chills, diaphoresis, appetite change and fatigue.  HENT: Negative for mouth sores, sore throat and trouble swallowing.        Lip swelling  Eyes: Negative for visual disturbance.  Respiratory:  Negative for cough, chest tightness, shortness of breath and wheezing.   Cardiovascular: Negative for chest pain.  Gastrointestinal: Negative for nausea, vomiting, abdominal pain, diarrhea and abdominal distention.  Endocrine: Negative for polydipsia, polyphagia and polyuria.  Genitourinary: Negative for dysuria, frequency and hematuria.  Musculoskeletal: Negative for gait problem.  Skin: Negative for color change, pallor and rash.  Neurological: Negative for dizziness, syncope, light-headedness and headaches.  Hematological: Does not bruise/bleed easily.  Psychiatric/Behavioral: Negative for behavioral problems and confusion.      Allergies  Bee venom; Tetanus toxoid; Erythromycin; and Penicillins  Home Medications   Prior to Admission medications   Medication Sig Start Date End Date Taking? Authorizing Provider  aspirin 81 MG tablet Take 81 mg by mouth every evening.     Yes Historical Provider, MD  Calcium Carbonate (SUPER CALCIUM) 1500 MG TABS Take 1 tablet by mouth 2 (two) times daily.     Yes Historical Provider, MD  Cholecalciferol (VITAMIN D-3 PO) Take 2,000 Units by mouth 2 (two) times daily.    Yes Historical Provider, MD  ciprofloxacin (CIPRO) 500 MG tablet Take one po BID Patient taking differently: Take 500 mg by mouth as needed. Take one po BID 05/13/14  Yes  Lafayette Dragon, MD  citalopram (CELEXA) 10 MG tablet Take 1 tablet by mouth Daily. 02/27/11  Yes Historical Provider, MD  EPIPEN 2-PAK 0.3 MG/0.3ML SOAJ injection Inject 0.3 mg into the skin See admin instructions. 01/19/16  Yes Historical Provider, MD  Misc Natural Products (OSTEO BI-FLEX TRIPLE STRENGTH PO) Take 1 tablet by mouth 2 (two) times daily.     Yes Historical Provider, MD  Multiple Vitamins-Minerals (CENTRUM SILVER PO) Take 1 tablet by mouth every evening.     Yes Historical Provider, MD  Probiotic Product ([employer] SENIOR PROBIOTIC PO) Take 1 tablet by mouth every evening.     Yes Historical Provider, MD  vitamin  B-12 (CYANOCOBALAMIN) 1000 MCG tablet Take 5,000 mcg by mouth daily.    Yes Historical Provider, MD  cetirizine (ZYRTEC) 10 MG tablet Take 1 tablet (10 mg total) by mouth daily. 1 po q day prn allergies 02/09/16   Tanna Furry, MD  dicyclomine (BENTYL) 10 MG capsule Take 1 tab 3 times daily as needed for cramps and spasms. Patient not taking: Reported on 02/09/2016 11/11/13   Willia Craze, NP  metroNIDAZOLE (FLAGYL) 250 MG tablet Take one po TID Patient not taking: Reported on 02/09/2016 05/13/14   Lafayette Dragon, MD  omeprazole (PRILOSEC) 40 MG capsule Take 1 capsule (40 mg total) by mouth daily. Patient not taking: Reported on 02/09/2016 09/25/13   Lafayette Dragon, MD  predniSONE (DELTASONE) 20 MG tablet Take 1 tablet (20 mg total) by mouth 2 (two) times daily with a meal. 02/09/16   Tanna Furry, MD   BP 123/58 mmHg  Pulse 64  Temp(Src) 98.4 F (36.9 C) (Oral)  Resp 16  SpO2 97% Physical Exam  Constitutional: She is oriented to person, place, and time. She appears well-developed and well-nourished. No distress.  HENT:  Head: Normocephalic.    Eyes: Conjunctivae are normal. Pupils are equal, round, and reactive to light. No scleral icterus.  Neck: Normal range of motion. Neck supple. No thyromegaly present.  Cardiovascular: Normal rate and regular rhythm.  Exam reveals no gallop and no friction rub.   No murmur heard. Pulmonary/Chest: Effort normal and breath sounds normal. No respiratory distress. She has no wheezes. She has no rales.  Normal neck. Clear lungs.  Abdominal: Soft. Bowel sounds are normal. She exhibits no distension. There is no tenderness. There is no rebound.  Musculoskeletal: Normal range of motion.  Neurological: She is alert and oriented to person, place, and time.  Skin: Skin is warm and dry. No rash noted.  No urticaria.  Psychiatric: She has a normal mood and affect. Her behavior is normal.    ED Course  Procedures (including critical care time) Labs Review Labs  Reviewed - No data to display  Imaging Review No results found. I have personally reviewed and evaluated these images and lab results as part of my medical decision-making.   EKG Interpretation None      MDM   Final diagnoses:  Allergic reaction, initial encounter    On recheck patient's symptoms have improved. Clinically has greatly reduce the amount of swelling. Some skin folds are evident in her lips and perioral tissues. Posterior fracture was normal she is eating and drinking without difficulty. She appropriate for discharge home to continue in 48 hours of Zyrtec and prednisone. Return if any worsening.    Tanna Furry, MD 02/09/16 1524

## 2016-02-16 ENCOUNTER — Telehealth: Payer: Self-pay | Admitting: Gastroenterology

## 2016-02-16 NOTE — Telephone Encounter (Signed)
Patient has hx diverticulitis and is a former [facility] patient. She is scheduled with Dr. Havery Moros on 02/28/16. She reports she had an explosive stool on Tuesday(not diarrhea just a lot of stool) Yesterday, she felt tired and slept a lot. She started having LLQ pain like she has had in past with diverticulitis. Denies fever or nausea. No bowel movement today. She will call her PCP today also. She is asking for Cipro and Flagyl. She understands to seek ED for fever or worsening pain. Please, advise.

## 2016-02-17 MED ORDER — METRONIDAZOLE 250 MG PO TABS: ORAL_TABLET | ORAL | Status: DC

## 2016-02-17 MED ORDER — CIPROFLOXACIN HCL 500 MG PO TABS
ORAL_TABLET | ORAL | Status: DC
Start: 2016-02-17 — End: 2016-02-28

## 2016-02-17 NOTE — Telephone Encounter (Signed)
Yes if she thinks she has symptoms c/w prior episode of diverticulitis you can treat her with 10 days of cipro / flagyl and I will see her for follow up in clinic. Thanks

## 2016-02-17 NOTE — Telephone Encounter (Signed)
Patient aware and rx's sent.

## 2016-02-28 ENCOUNTER — Encounter: Payer: Self-pay | Admitting: Gastroenterology

## 2016-02-28 ENCOUNTER — Ambulatory Visit (INDEPENDENT_AMBULATORY_CARE_PROVIDER_SITE_OTHER): Payer: Medicare Other | Admitting: Gastroenterology

## 2016-02-28 VITALS — BP 120/60 | HR 70 | Ht 63.0 in | Wt 154.4 lb

## 2016-02-28 DIAGNOSIS — K5732 Diverticulitis of large intestine without perforation or abscess without bleeding: Secondary | ICD-10-CM

## 2016-02-28 DIAGNOSIS — R1032 Left lower quadrant pain: Secondary | ICD-10-CM

## 2016-02-28 MED ORDER — DICYCLOMINE HCL 10 MG PO CAPS: 10.0000 mg | ORAL_CAPSULE | Freq: Three times a day (TID) | ORAL | Status: DC | PRN

## 2016-02-28 NOTE — Patient Instructions (Addendum)
If you are age 80 or older, your body mass index should be between 23-30. Your Body mass index is 27.36 kg/(m^2). If this is out of the aforementioned range listed, please consider follow up with your Primary Care Provider.  If you are age 60 or younger, your body mass index should be between 19-25. Your Body mass index is 27.36 kg/(m^2). If this is out of the aformentioned range listed, please consider follow up with your Primary Care Provider.   We have sent the following medications to your pharmacy for you to pick up at your convenience: Bentyl   Please read over the information about fiber given to you today.   Thank you for choosing [facility]  Dr Chauncey Cruel. Armbruster

## 2016-02-28 NOTE — Progress Notes (Signed)
HPI :  82 y/o female here for a follow up visit for suspected diverticulosis. She has severe sigmoid diverticulosis noted on prior colonoscopy in 2012. Also with a reported history of Barrett's in 2012, however last EGD in 2015 did not show Barrett's, only esophagitis.   She endorses a prior history of diverticulitis, who had a flare up a few weeks ago. She developed abdominal pain in the LLQ, which she has had several times in the past. Worsening with a bowel movements. She did not endorse fevers at the time. She was given a course of ciprofloxacin and flagyl for 10 days. She reported symptoms improved on this regimen. Her stools are dark brown. She is having 2-3 BMs per day. She thinks the pain is mostly gone, present just mildly. She has never been on bentyl but continues to have some spasms. No red blood in the stools. She has had several episodes of diverticulitis reported in the past but only one prior CT noted in recent years which did not show this. She has not been admitted for it in the past. It has been several years since she had an episode that she recalls.   EGD 09/25/13 - esophagitis, 2cm HH, biopsies negative for BE EGD 04/12/11 - positive short segment BE Colonoscopy 04/12/11 - severe sigmoid diverticulosis, no polyps.  Past Medical History  Diagnosis Date  . Hyperlipidemia   . Asthma   . Arthritis   . Depression   . Cataract   . COPD (chronic obstructive pulmonary disease) ([place])   . Hiatal hernia   . Stricture of esophagus   . Diverticulosis   . Barrett esophagus   . Anxiety   . Hemorrhoids   . Irritable bowel   . Pancreatitis   . Shingles 1998    Back and around to torso     Past Surgical History  Procedure Laterality Date  . Abdominal hysterectomy    . Tonsillectomy    . Cataract extraction Bilateral    Family History  Problem Relation Age of Onset  . Colon cancer Mother 38  . Prostate cancer Father   . Colon cancer Maternal Aunt   . Stomach cancer  Maternal Grandmother 47  . Colon cancer Maternal Aunt   . Diabetes Mother    Social History  Substance Use Topics  . Smoking status: Former Smoker    Types: Cigarettes    Quit date: 08/20/1998  . Smokeless tobacco: Never Used  . Alcohol Use: 1.2 oz/week    2 Glasses of wine per week   Current Outpatient Prescriptions  Medication Sig Dispense Refill  . aspirin 81 MG tablet Take 81 mg by mouth every evening.      . Calcium Carbonate (SUPER CALCIUM) 1500 MG TABS Take 1 tablet by mouth 2 (two) times daily.      . cetirizine (ZYRTEC) 10 MG tablet Take 1 tablet (10 mg total) by mouth daily. 1 po q day prn allergies 30 tablet 0  . Cholecalciferol (VITAMIN D-3 PO) Take 2,000 Units by mouth 2 (two) times daily.     . citalopram (CELEXA) 10 MG tablet Take 1 tablet by mouth Daily.    Marland Kitchen EPIPEN 2-PAK 0.3 MG/0.3ML SOAJ injection Inject 0.3 mg into the skin See admin instructions.  0  . Misc Natural Products (OSTEO BI-FLEX TRIPLE STRENGTH PO) Take 1 tablet by mouth 2 (two) times daily.      . Multiple Vitamins-Minerals (CENTRUM SILVER PO) Take 1 tablet by mouth every evening.      Marland Kitchen  Probiotic Product ([employer] SENIOR PROBIOTIC PO) Take 1 tablet by mouth every evening.      . vitamin B-12 (CYANOCOBALAMIN) 1000 MCG tablet Take 5,000 mcg by mouth daily.      No current facility-administered medications for this visit.   Allergies  Allergen Reactions  . Bee Venom Anaphylaxis  . Tetanus Toxoid   . Erythromycin Rash  . Penicillins Rash    MOUTH SWELLING     Review of Systems: All systems reviewed and negative except where noted in HPI.   Lab Results  Component Value Date   WBC 6.1 11/16/2013   HGB 13.3 11/16/2013   HCT 40.1 11/16/2013   MCV 89.8 11/16/2013   PLT 283.0 11/16/2013     Physical Exam: BP 120/60 mmHg  Pulse 70  Ht 5\' 3"  (1.6 m)  Wt 154 lb 6.4 oz (70.035 kg)  BMI 27.36 kg/m2 Constitutional: Pleasant,well-developed, female in no acute distress. HEENT: Normocephalic and  atraumatic. Conjunctivae are normal. No scleral icterus. Neck supple.  Cardiovascular: Normal rate, regular rhythm.  Pulmonary/chest: Effort normal and breath sounds normal. No wheezing, rales or rhonchi. Abdominal: Soft, nondistended, nontender. Bowel sounds active throughout. There are no masses palpable. No hepatomegaly. Extremities: no edema Lymphadenopathy: No cervical adenopathy noted. Neurological: Alert and oriented to person place and time. Skin: Skin is warm and dry. No rashes noted. Psychiatric: Normal mood and affect. Behavior is normal.   ASSESSMENT AND PLAN: 82 y/o female here for reassessment for history of suspected diverticulitis / diverticulosis - multiple reported episodes over the years. Prior colonoscopy shows severe diverticulosis. She had only one CT scan in 2009 which did not show diverticulitis but she has responded well to treatment for diverticulitis historically. Unclear if she truly had diverticulitis, I suspect she did, versus symptomatic diverticulosis. If she has symptoms in the future we may want to obtain another CT scan to confirm this. Otherwise, recommend a daily fiber supplement for her and she can take bentyl to use PRN for cramps in the future. She can call for reassessment with recurrence of symptoms.   Menahga Cellar, MD [facility] Pager [phone]

## 2016-03-26 ENCOUNTER — Other Ambulatory Visit (HOSPITAL_COMMUNITY): Payer: Self-pay | Admitting: Respiratory Therapy

## 2016-03-26 DIAGNOSIS — J45909 Unspecified asthma, uncomplicated: Secondary | ICD-10-CM

## 2016-04-03 ENCOUNTER — Other Ambulatory Visit: Payer: Self-pay | Admitting: Internal Medicine

## 2016-04-03 ENCOUNTER — Ambulatory Visit
Admission: RE | Admit: 2016-04-03 | Discharge: 2016-04-03 | Disposition: A | Discharge status: Home / Self Care | Payer: Medicare Other | Source: Ambulatory Visit | Attending: Internal Medicine | Admitting: Internal Medicine

## 2016-04-03 DIAGNOSIS — J4530 Mild persistent asthma, uncomplicated: Secondary | ICD-10-CM

## 2016-04-06 ENCOUNTER — Ambulatory Visit (HOSPITAL_COMMUNITY)
Admission: RE | Admit: 2016-04-06 | Discharge: 2016-04-06 | Disposition: A | Discharge status: Home / Self Care | Payer: Medicare Other | Source: Ambulatory Visit | Attending: Internal Medicine | Admitting: Internal Medicine

## 2016-04-06 DIAGNOSIS — J45909 Unspecified asthma, uncomplicated: Secondary | ICD-10-CM | POA: Diagnosis present

## 2016-04-06 LAB — PULMONARY FUNCTION TEST
DL/VA % pred: 99 %
DL/VA: 4.52 ml/min/mmHg/L
DLCO unc % pred: 77 %
DLCO unc: 16.76 ml/min/mmHg
FEF 25-75 Pre: 0.7 L/sec
FEF2575-%Pred-Pre: 64 %
FEV1-%Pred-Pre: 83 %
FEV1-Pre: 1.34 L
FEV1FVC-%Pred-Pre: 88 %
FEV6-%Pred-Pre: 98 %
FEV6-Pre: 2.04 L
FEV6FVC-%Pred-Pre: 104 %
FVC-%Pred-Pre: 94 %
FVC-Pre: 2.09 L
Pre FEV1/FVC ratio: 64 %
Pre FEV6/FVC Ratio: 98 %

## 2017-01-23 ENCOUNTER — Telehealth: Payer: Self-pay | Admitting: Gastroenterology

## 2017-01-24 ENCOUNTER — Other Ambulatory Visit: Payer: Self-pay

## 2017-01-24 MED ORDER — METRONIDAZOLE 500 MG PO TABS: 500.0000 mg | ORAL_TABLET | Freq: Three times a day (TID) | ORAL | 0 refills | Status: DC

## 2017-01-24 MED ORDER — CIPROFLOXACIN HCL 500 MG PO TABS: 500.0000 mg | ORAL_TABLET | Freq: Two times a day (BID) | ORAL | 0 refills | Status: DC

## 2017-01-24 NOTE — Telephone Encounter (Signed)
Okay. The easiest thing to do is to just treat her empirically for diverticulitis with 7 day course of cipro 500mg  q 12 hours and flagyl 500mg  TID, as she has responded to this in the past. She should also use some Miralax for constipation. If this does not help and symptoms persist or worsen she should contact us for reassessment. Thanks

## 2017-01-24 NOTE — Telephone Encounter (Signed)
Debbie Morris are her symptoms consistent with her prior diverticulitis type symptoms or does she just feel constipated? If she feels constipated, would recommend Miralax twice daily, titrate as needed. If she is having pain concerning for diverticulitis, we may consider a CT scan to assess for this. Can you help clarify? thanks

## 2017-01-24 NOTE — Telephone Encounter (Signed)
Spoke to patient, she has lower abdominal pain times 3 days now. She has been constipated for several weeks, has not taken anything other than prunes, which is not helping. She denies fever or blood in her stools, but + for mucus. She is questioning whether or not she needs ov or something called in.

## 2017-01-24 NOTE — Telephone Encounter (Signed)
Patient states it "feels like it did before with a diverticulitis flare", she did say that there is a little relief when she passes stool.

## 2017-01-24 NOTE — Telephone Encounter (Signed)
Patient advised that we will send in Rx to treat for diverticulitis. Instructed to take Miralax for constipation and let us know if symptoms worsen or do not clear up.

## 2017-03-18 ENCOUNTER — Other Ambulatory Visit: Payer: Self-pay | Admitting: Internal Medicine

## 2017-03-18 DIAGNOSIS — R42 Dizziness and giddiness: Secondary | ICD-10-CM

## 2017-03-18 DIAGNOSIS — R519 Headache, unspecified: Secondary | ICD-10-CM

## 2017-03-18 DIAGNOSIS — R51 Headache: Secondary | ICD-10-CM

## 2017-03-19 ENCOUNTER — Ambulatory Visit
Admission: RE | Admit: 2017-03-19 | Discharge: 2017-03-19 | Disposition: A | Discharge status: Home / Self Care | Payer: Medicare Other | Source: Ambulatory Visit | Attending: Internal Medicine | Admitting: Internal Medicine

## 2017-03-19 DIAGNOSIS — R42 Dizziness and giddiness: Secondary | ICD-10-CM

## 2017-03-19 DIAGNOSIS — R51 Headache: Secondary | ICD-10-CM

## 2017-03-19 DIAGNOSIS — R519 Headache, unspecified: Secondary | ICD-10-CM

## 2018-12-02 ENCOUNTER — Ambulatory Visit
Admission: RE | Admit: 2018-12-02 | Discharge: 2018-12-02 | Disposition: A | Discharge status: Home / Self Care | Payer: Medicare Other | Source: Ambulatory Visit | Attending: Internal Medicine | Admitting: Internal Medicine

## 2018-12-02 ENCOUNTER — Other Ambulatory Visit: Payer: Self-pay

## 2018-12-02 ENCOUNTER — Other Ambulatory Visit: Payer: Self-pay | Admitting: Internal Medicine

## 2018-12-02 DIAGNOSIS — W19XXXA Unspecified fall, initial encounter: Secondary | ICD-10-CM

## 2018-12-02 DIAGNOSIS — R102 Pelvic and perineal pain: Secondary | ICD-10-CM

## 2018-12-11 ENCOUNTER — Other Ambulatory Visit: Payer: Self-pay | Admitting: Internal Medicine

## 2018-12-11 ENCOUNTER — Ambulatory Visit
Admission: RE | Admit: 2018-12-11 | Discharge: 2018-12-11 | Disposition: A | Discharge status: Home / Self Care | Payer: Medicare Other | Source: Ambulatory Visit | Attending: Internal Medicine | Admitting: Internal Medicine

## 2018-12-11 DIAGNOSIS — R0781 Pleurodynia: Secondary | ICD-10-CM

## 2019-02-27 ENCOUNTER — Other Ambulatory Visit: Payer: Self-pay | Admitting: Internal Medicine

## 2019-02-27 DIAGNOSIS — M810 Age-related osteoporosis without current pathological fracture: Secondary | ICD-10-CM

## 2019-03-19 ENCOUNTER — Other Ambulatory Visit: Payer: Self-pay | Admitting: Internal Medicine

## 2019-03-19 DIAGNOSIS — M8580 Other specified disorders of bone density and structure, unspecified site: Secondary | ICD-10-CM

## 2019-03-19 DIAGNOSIS — M810 Age-related osteoporosis without current pathological fracture: Secondary | ICD-10-CM

## 2019-03-30 ENCOUNTER — Other Ambulatory Visit: Payer: Medicare Other

## 2019-06-09 ENCOUNTER — Ambulatory Visit
Admission: RE | Admit: 2019-06-09 | Discharge: 2019-06-09 | Disposition: A | Discharge status: Home / Self Care | Payer: Medicare Other | Source: Ambulatory Visit | Attending: Internal Medicine | Admitting: Internal Medicine

## 2019-06-09 ENCOUNTER — Other Ambulatory Visit: Payer: Self-pay

## 2019-06-09 DIAGNOSIS — M8580 Other specified disorders of bone density and structure, unspecified site: Secondary | ICD-10-CM

## 2019-06-09 DIAGNOSIS — M810 Age-related osteoporosis without current pathological fracture: Secondary | ICD-10-CM

## 2019-12-10 ENCOUNTER — Emergency Department (HOSPITAL_COMMUNITY)
Admission: EM | Admit: 2019-12-10 | Discharge: 2019-12-10 | Disposition: A | Discharge status: Home / Self Care | Payer: Medicare Other | Attending: Emergency Medicine | Admitting: Emergency Medicine

## 2019-12-10 ENCOUNTER — Encounter (HOSPITAL_COMMUNITY): Payer: Self-pay | Admitting: Emergency Medicine

## 2019-12-10 ENCOUNTER — Other Ambulatory Visit: Payer: Self-pay

## 2019-12-10 DIAGNOSIS — R300 Dysuria: Secondary | ICD-10-CM | POA: Insufficient documentation

## 2019-12-10 DIAGNOSIS — J449 Chronic obstructive pulmonary disease, unspecified: Secondary | ICD-10-CM | POA: Diagnosis not present

## 2019-12-10 DIAGNOSIS — J45909 Unspecified asthma, uncomplicated: Secondary | ICD-10-CM | POA: Diagnosis not present

## 2019-12-10 DIAGNOSIS — Z79899 Other long term (current) drug therapy: Secondary | ICD-10-CM | POA: Insufficient documentation

## 2019-12-10 DIAGNOSIS — Z87891 Personal history of nicotine dependence: Secondary | ICD-10-CM | POA: Insufficient documentation

## 2019-12-10 LAB — COMPREHENSIVE METABOLIC PANEL WITH GFR
ALT: 11 U/L (ref 0–44)
AST: 15 U/L (ref 15–41)
Albumin: 4.1 g/dL (ref 3.5–5.0)
Alkaline Phosphatase: 62 U/L (ref 38–126)
Anion gap: 7 (ref 5–15)
BUN: 14 mg/dL (ref 8–23)
CO2: 27 mmol/L (ref 22–32)
Calcium: 8.8 mg/dL — ABNORMAL LOW (ref 8.9–10.3)
Chloride: 105 mmol/L (ref 98–111)
Creatinine, Ser: 0.84 mg/dL (ref 0.44–1.00)
GFR calc Af Amer: >60 mL/min
GFR calc non Af Amer: >60 mL/min
Glucose, Bld: 103 mg/dL — ABNORMAL HIGH (ref 70–99)
Potassium: 4.2 mmol/L (ref 3.5–5.1)
Sodium: 139 mmol/L (ref 135–145)
Total Bilirubin: 0.5 mg/dL (ref 0.3–1.2)
Total Protein: 6.9 g/dL (ref 6.5–8.1)

## 2019-12-10 LAB — CBC WITH DIFFERENTIAL/PLATELET
Abs Immature Granulocytes: 0.01 10*3/uL (ref 0.00–0.07)
Basophils Absolute: 0 10*3/uL (ref 0.0–0.1)
Basophils Relative: 1 %
Eosinophils Absolute: 0.1 10*3/uL (ref 0.0–0.5)
Eosinophils Relative: 2 %
HCT: 43 % (ref 36.0–46.0)
Hemoglobin: 13.8 g/dL (ref 12.0–15.0)
Immature Granulocytes: 0 %
Lymphocytes Relative: 26 %
Lymphs Abs: 1.5 10*3/uL (ref 0.7–4.0)
MCH: 31 pg (ref 26.0–34.0)
MCHC: 32.1 g/dL (ref 30.0–36.0)
MCV: 96.6 fL (ref 80.0–100.0)
Monocytes Absolute: 0.6 10*3/uL (ref 0.1–1.0)
Monocytes Relative: 10 %
Neutro Abs: 3.4 10*3/uL (ref 1.7–7.7)
Neutrophils Relative %: 61 %
Platelets: 242 10*3/uL (ref 150–400)
RBC: 4.45 MIL/uL (ref 3.87–5.11)
RDW: 12.5 % (ref 11.5–15.5)
WBC: 5.7 10*3/uL (ref 4.0–10.5)
nRBC: 0 % (ref 0.0–0.2)

## 2019-12-10 LAB — URINALYSIS, ROUTINE W REFLEX MICROSCOPIC
Bacteria, UA: NONE SEEN
Bilirubin Urine: NEGATIVE
Glucose, UA: NEGATIVE mg/dL
Ketones, ur: NEGATIVE mg/dL
Leukocytes,Ua: NEGATIVE
Nitrite: NEGATIVE
Protein, ur: NEGATIVE mg/dL
Specific Gravity, Urine: 1.023 (ref 1.005–1.030)
pH: 5 (ref 5.0–8.0)

## 2019-12-10 LAB — ETHANOL: Alcohol, Ethyl (B): <10 mg/dL (ref <10)

## 2019-12-10 NOTE — ED Triage Notes (Signed)
Per family member, states patient's PCP, Dr Inda Merlin, told them to come here for UTI symptoms and mental health work up-states patient has been hallucinating-patient states she is having urinary frequency-family member states PCP suggested she go to [facility]

## 2019-12-10 NOTE — ED Provider Notes (Signed)
[facility] DEPT Provider Note   CSN: [id number] Arrival date & time: 12/10/19  1116     History Chief Complaint  Patient presents with  . Dysuria    Nakasha Mesaros is a 86 y.o. female.  HPI   Patient presents with her sister-in-law who assists with the history. Patient presents with questionable dysuria.  Patient self denies any physical complaints including pain, discomfort, shortness of breath, fever, nausea.  She states that she has been doing generally well, but today after complaining of some difficulty with urination, she was sent here for evaluation.  On she has no insight into other concerns.  These other concerns are related by the patient's sister-in-law, and via chart review from her Langley Gauss, and primary care physician. Some concern for the patient having worsening cognitive capacity, and the patient was sent here both for urinalysis, medical evaluation as well as for consideration of behavioral health interview. Patient reiterates that she has no complaints, is unsure why she is here. Past Medical History:  Diagnosis Date  . Anxiety   . Arthritis   . Asthma   . Barrett esophagus   . Cataract   . COPD (chronic obstructive pulmonary disease) ([place])   . Depression   . Diverticulosis   . Hemorrhoids   . Hiatal hernia   . Hyperlipidemia   . Irritable bowel   . Pancreatitis   . Shingles 1998   Back and around to torso  . Stricture of esophagus     Patient Active Problem List   Diagnosis Date Noted  . HYPERLIPIDEMIA 09/15/2010  . HEMORRHOIDS 09/15/2010  . COPD 09/15/2010  . GERD 09/15/2010  . DIVERTICULITIS OF COLON 09/15/2010  . ARTHRITIS 09/15/2010  . ABDOMINAL PAIN, LEFT LOWER QUADRANT 09/15/2010  . ANXIETY 04/21/2008  . DEPRESSION 04/21/2008  . CATARACTS 04/21/2008  . ASTHMA, UNSPECIFIED 04/21/2008  . ESOPHAGEAL STRICTURE 04/21/2008  . HIATAL HERNIA 04/21/2008  . DIVERTICULOSIS OF COLON 04/21/2008  . IRRITABLE  BOWEL SYNDROME 04/21/2008  . ACUTE PANCREATITIS 04/21/2008  . COLONIC POLYPS, HYPERPLASTIC, HX OF 04/21/2008    Past Surgical History:  Procedure Laterality Date  . ABDOMINAL HYSTERECTOMY    . CATARACT EXTRACTION Bilateral   . TONSILLECTOMY       OB History   No obstetric history on file.     Family History  Problem Relation Age of Onset  . Colon cancer Mother 35  . Diabetes Mother   . Prostate cancer Father   . Colon cancer Maternal Aunt   . Stomach cancer Maternal Grandmother 67  . Colon cancer Maternal Aunt     Social History   Tobacco Use  . Smoking status: Former Smoker    Types: Cigarettes    Quit date: 08/20/1998    Years since quitting: 21.3  . Smokeless tobacco: Never Used  Substance Use Topics  . Alcohol use: Yes    Alcohol/week: 2.0 standard drinks    Types: 2 Glasses of wine per week  . Drug use: No    Home Medications Prior to Admission medications   Medication Sig Start Date End Date Taking? Authorizing Provider  EPIPEN 2-PAK 0.3 MG/0.3ML SOAJ injection Inject 0.3 mg into the skin See admin instructions. 01/19/16  Yes [provider]  cetirizine (ZYRTEC) 10 MG tablet Take 1 tablet (10 mg total) by mouth daily. 1 po q day prn allergies Patient not taking: Reported on 12/10/2019 02/09/16   Tanna Furry, MD  ciprofloxacin (CIPRO) 500 MG tablet Take 1 tablet (  500 mg total) by mouth every 12 (twelve) hours. Patient not taking: Reported on 12/10/2019 01/24/17   Armbruster, Carlota Raspberry, MD  dicyclomine (BENTYL) 10 MG capsule Take 1 capsule (10 mg total) by mouth every 8 (eight) hours as needed for spasms. Patient not taking: Reported on 12/10/2019 02/28/16   Yetta Flock, MD  metroNIDAZOLE (FLAGYL) 500 MG tablet Take 1 tablet (500 mg total) by mouth 3 (three) times daily. Patient not taking: Reported on 12/10/2019 01/24/17   Armbruster, Carlota Raspberry, MD  Misc Natural Products (OSTEO BI-FLEX TRIPLE STRENGTH PO) Take 1 tablet by mouth 2 (two) times daily.       [provider]    Allergies    Bee venom, Tetanus toxoid, Erythromycin, and Penicillins  Review of Systems   Review of Systems  Constitutional:       Per HPI, otherwise negative  HENT:       Per HPI, otherwise negative  Respiratory:       Per HPI, otherwise negative  Cardiovascular:       Per HPI, otherwise negative  Gastrointestinal: Negative for vomiting.  Endocrine:       Negative aside from HPI  Genitourinary:       Neg aside from HPI   Musculoskeletal:       Per HPI, otherwise negative  Skin: Negative.   Neurological: Negative for syncope.    Physical Exam Updated Vital Signs BP (!) 168/72   Pulse 70   Temp 98 F (36.7 C) (Oral)   Resp 18   SpO2 100%   Physical Exam Vitals and nursing note reviewed.  Constitutional:      General: She is not in acute distress.    Appearance: She is well-developed.  HENT:     Head: Normocephalic and atraumatic.  Eyes:     Conjunctiva/sclera: Conjunctivae normal.  Cardiovascular:     Rate and Rhythm: Normal rate and regular rhythm.     Heart sounds: Murmur present.  Pulmonary:     Effort: Pulmonary effort is normal. No respiratory distress.     Breath sounds: Normal breath sounds. No stridor.  Abdominal:     General: There is no distension.  Skin:    General: Skin is warm and dry.  Neurological:     Mental Status: She is alert and oriented to person, place, and time.     Cranial Nerves: No cranial nerve deficit.     Motor: No weakness.     Comments: Mild weakness, with age-appropriate atrophy, no focal weakness, patient moves all extremities spontaneously, face is symmetric, speech is clear.  Psychiatric:        Behavior: Behavior normal.     ED Results / Procedures / Treatments   Labs (all labs ordered are listed, but only abnormal results are displayed) Labs Reviewed  URINALYSIS, ROUTINE W REFLEX MICROSCOPIC - Abnormal; Notable for the following components:      Result Value   Hgb urine dipstick  SMALL (*)    All other components within normal limits  COMPREHENSIVE METABOLIC PANEL - Abnormal; Notable for the following components:   Glucose, Bld 103 (*)    Calcium 8.8 (*)    All other components within normal limits  URINE CULTURE  ETHANOL  CBC WITH DIFFERENTIAL/PLATELET    Procedures Procedures (including critical care time)  Medications Ordered in ED Medications - No data to display  ED Course  I have reviewed the triage vital signs and the nursing notes.  Pertinent labs &  imaging results that were available during my care of the patient were reviewed by me and considered in my medical decision making (see chart for details).   Initial labs, urinalysis all reassuring.  I attempted to contact the patient's niece, but she was unavailable by telephone.  5:26 PM Patient in no distress, awake, alert, states that she is ready to go home to see her cat. I discussed all findings with her, and her sister-in-law at length.  After discussed the findings with the sister-in-law outside the patient's room.  Sister-in-law notes that both she and her husband feel as though the patient is capable of remaining in her own home, they deny that the patient has had any hallucinations.  Sister-in-law notes that the patient does have some memory lapses, but with direction, participates in ADL, goes to clinic appointments, and functions reasonably well for her age at home. We discussed reported concerns of other family members, including the patient's niece who is not present.  I attempted to contact that individual 3 separate times, and had a busy signal each time.  With the sister-in-law we discussed returning home with outpatient follow-up with her primary care physician who can assist with home health services, or for advanced planning for possible nursing home in the future.  Currently the patient denies any desire to stay in the hospital, to be placed anywhere other than home.  She has capacity to  make this request, has had no prior evaluation regards to her inability to have capacity, and thus, is appropriate for discharge, per her request to go home, and will follow up with her physician, with the assistance of her sister in law. Final Clinical Impression(s) / ED Diagnoses Final diagnoses:  Dysuria     Carmin Muskrat, MD 12/10/19 1729

## 2019-12-10 NOTE — ED Notes (Signed)
Glenda Chroman, niece is the POA, said Dr. Inda Merlin said to bring her in for an evaluation. Please call niece, [phone].

## 2019-12-10 NOTE — Discharge Instructions (Addendum)
As discussed, your evaluation today has been largely reassuring.  But, it is important that you monitor your condition carefully, and do not hesitate to return to the ED if you develop new, or concerning changes in your condition.  Otherwise, please follow-up with your physician for appropriate ongoing care.  Please be sure to discuss today's evaluation and how you have been doing recently at home.  Please consider discussion on assistance at home should it be required and desired.

## 2019-12-12 LAB — URINE CULTURE

## 2020-02-19 ENCOUNTER — Ambulatory Visit: Payer: Self-pay | Admitting: Family

## 2020-07-17 NOTE — Progress Notes (Deleted)
[facility]    Provider:  Dr Jaynee Eagles Requesting Provider: Sueanne Margarita, DO Primary Care Provider:  Josetta Huddle, MD  CC:  dementia  HPI:  Debbie Morris is a 86 y.o. female here as requested by Sueanne Margarita, DO for dementia. PMHx COPD, pancreatitis, B12 deficiency getting injections, alcohol use/abuse, osteoarthritis, IBS, anxiety, depression,HLD, dementia.  Patient had an MRI back in 2002 for memory problems so appears this is been ongoing for close to 20 years.  I reviewed notes by Dr. Luanna Salk notes: would like help ascertaining which "type" of moderate dementia she has, dementia based on Moca and MMSE, it sounds like physician has had conversations with family about living facilities in memory units, he also discussed she is not suitable to be driving anymore, diagnosed with moderate dementia based on Moca and MMSE, executive function is okay per his notes making primary care think this is more from alcohol and vitamin deficiencies, possibly stress, also she was not eating well and consuming alcohol, we talked about MRI of the brain and replacing the vitamins, limiting alcohol as much as possible, no driving, she was diagnosed with B12 deficiency and she is doing monthly B12 injections, alcohol use unspecified.  Reviewed notes, labs and imaging from outside physicians, which showed ***  Reviewed labs RPR nonreactive, vitamin B12 was greater than 2000 (she is on supplementation for prior B12 deficiency), zinc and copper and folic acid unremarkable, CMP collected March 11, 2020 was unremarkable and showed creatinine of 1 and BUN 16.  CT of the head in 2018 showed chronic microvascular changes, remote right caudate infarct, personally reviewed images.  Reviewed MRI of the brain from 2002 completed for memory loss, showed mild atrophy, no infarction, intact white matter, no mass, no fluid collections, reviewed report no images available.  Review of Systems: Patient  complains of symptoms per HPI as well as the following symptoms ***. Pertinent negatives and positives per HPI. All others negative.   Social History   Socioeconomic History  . Marital status: Divorced    Spouse name: Not on file  . Number of children: 0  . Years of education: Not on file  . Highest education level: Not on file  Occupational History    Employer: RETIRED  Tobacco Use  . Smoking status: Former Smoker    Types: Cigarettes    Quit date: 08/20/1998    Years since quitting: 21.9  . Smokeless tobacco: Never Used  Substance and Sexual Activity  . Alcohol use: Yes    Alcohol/week: 2.0 standard drinks    Types: 2 Glasses of wine per week  . Drug use: No  . Sexual activity: Not on file  Other Topics Concern  . Not on file  Social History Narrative  . Not on file   Social Determinants of Health   Financial Resource Strain:   . Difficulty of Paying Living Expenses: Not on file  Food Insecurity:   . Worried About Charity fundraiser in the Last Year: Not on file  . Ran Out of Food in the Last Year: Not on file  Transportation Needs:   . Lack of Transportation (Medical): Not on file  . Lack of Transportation (Non-Medical): Not on file  Physical Activity:   . Days of Exercise per Week: Not on file  . Minutes of Exercise per Session: Not on file  Stress:   . Feeling of Stress : Not on file  Social Connections:   . Frequency of Communication with Friends and  Family: Not on file  . Frequency of Social Gatherings with Friends and Family: Not on file  . Attends Religious Services: Not on file  . Active Member of Clubs or Organizations: Not on file  . Attends Archivist Meetings: Not on file  . Marital Status: Not on file  Intimate Partner Violence:   . Fear of Current or Ex-Partner: Not on file  . Emotionally Abused: Not on file  . Physically Abused: Not on file  . Sexually Abused: Not on file    Family History  Problem Relation Age of Onset  . Colon  cancer Mother 66  . Diabetes Mother   . Prostate cancer Father   . Colon cancer Maternal Aunt   . Stomach cancer Maternal Grandmother 75  . Colon cancer Maternal Aunt     Past Medical History:  Diagnosis Date  . Anxiety   . Arthritis   . Asthma   . Barrett esophagus   . Cataract   . COPD (chronic obstructive pulmonary disease) ([place])   . Depression   . Diverticulosis   . Hemorrhoids   . Hiatal hernia   . Hyperlipidemia   . Irritable bowel   . Pancreatitis   . Shingles 1998   Back and around to torso  . Stricture of esophagus     Patient Active Problem List   Diagnosis Date Noted  . HYPERLIPIDEMIA 09/15/2010  . HEMORRHOIDS 09/15/2010  . COPD 09/15/2010  . GERD 09/15/2010  . DIVERTICULITIS OF COLON 09/15/2010  . ARTHRITIS 09/15/2010  . ABDOMINAL PAIN, LEFT LOWER QUADRANT 09/15/2010  . ANXIETY 04/21/2008  . DEPRESSION 04/21/2008  . CATARACTS 04/21/2008  . ASTHMA, UNSPECIFIED 04/21/2008  . ESOPHAGEAL STRICTURE 04/21/2008  . HIATAL HERNIA 04/21/2008  . DIVERTICULOSIS OF COLON 04/21/2008  . IRRITABLE BOWEL SYNDROME 04/21/2008  . ACUTE PANCREATITIS 04/21/2008  . COLONIC POLYPS, HYPERPLASTIC, HX OF 04/21/2008    Past Surgical History:  Procedure Laterality Date  . ABDOMINAL HYSTERECTOMY    . CATARACT EXTRACTION Bilateral   . TONSILLECTOMY      Current Outpatient Medications  Medication Sig Dispense Refill  . cetirizine (ZYRTEC) 10 MG tablet Take 1 tablet (10 mg total) by mouth daily. 1 po q day prn allergies (Patient not taking: Reported on 12/10/2019) 30 tablet 0  . ciprofloxacin (CIPRO) 500 MG tablet Take 1 tablet (500 mg total) by mouth every 12 (twelve) hours. (Patient not taking: Reported on 12/10/2019) 14 tablet 0  . dicyclomine (BENTYL) 10 MG capsule Take 1 capsule (10 mg total) by mouth every 8 (eight) hours as needed for spasms. (Patient not taking: Reported on 12/10/2019) 30 capsule 3  . EPIPEN 2-PAK 0.3 MG/0.3ML SOAJ injection Inject 0.3 mg into the skin  See admin instructions.  0  . metroNIDAZOLE (FLAGYL) 500 MG tablet Take 1 tablet (500 mg total) by mouth 3 (three) times daily. (Patient not taking: Reported on 12/10/2019) 21 tablet 0  . Misc Natural Products (OSTEO BI-FLEX TRIPLE STRENGTH PO) Take 1 tablet by mouth 2 (two) times daily.       No current facility-administered medications for this visit.    Allergies as of 07/18/2020 - Review Complete 12/10/2019  Allergen Reaction Noted  . Bee venom Anaphylaxis 09/09/2014  . Tetanus toxoid  04/21/2008  . Erythromycin Rash 04/21/2008  . Penicillins Rash 04/21/2008    Vitals: There were no vitals taken for this visit. Last Weight:  Wt Readings from Last 1 Encounters:  02/28/16 154 lb 6.4 oz (70 kg)  Last Height:   Ht Readings from Last 1 Encounters:  02/28/16 5\' 3"  (1.6 m)     Physical exam: Exam: Gen: NAD, conversant, well nourised, obese, well groomed                     CV: RRR, no MRG. No Carotid Bruits. No peripheral edema, warm, nontender Eyes: Conjunctivae clear without exudates or hemorrhage  Neuro: Detailed Neurologic Exam  Speech:    Speech is normal; fluent and spontaneous with normal comprehension.  Cognition:    The patient is oriented to person, place, and time;     recent and remote memory intact;     language fluent;     normal attention, concentration,     fund of knowledge Cranial Nerves:    The pupils are equal, round, and reactive to light. The fundi are normal and spontaneous venous pulsations are present. Visual fields are full to finger confrontation. Extraocular movements are intact. Trigeminal sensation is intact and the muscles of mastication are normal. The face is symmetric. The palate elevates in the midline. Hearing intact. Voice is normal. Shoulder shrug is normal. The tongue has normal motion without fasciculations.   Coordination:    Normal finger to nose and heel to shin. Normal rapid alternating movements.   Gait:    Heel-toe and  tandem gait are normal.   Motor Observation:    No asymmetry, no atrophy, and no involuntary movements noted. Tone:    Normal muscle tone.    Posture:    Posture is normal. normal erect    Strength:    Strength is V/V in the upper and lower limbs.      Sensation: intact to LT     Reflex Exam:  DTR's:    Deep tendon reflexes in the upper and lower extremities are normal bilaterally.   Toes:    The toes are downgoing bilaterally.   Clonus:    Clonus is absent.    Assessment/Plan:    No orders of the defined types were placed in this encounter.  No orders of the defined types were placed in this encounter.   Cc: Sueanne Margarita, DO,  Josetta Huddle, MD  [facility], [facility] [address] [place], [place] [address]  Phone [phone] Fax [phone]

## 2020-07-18 ENCOUNTER — Telehealth: Payer: Self-pay | Admitting: Neurology

## 2020-07-18 ENCOUNTER — Ambulatory Visit: Payer: Medicare Other | Admitting: Neurology

## 2020-07-18 NOTE — Telephone Encounter (Signed)
Pt will not be coming in for her appt today due to her and caregiver's coming down with Covid during the holiday's.

## 2020-10-25 ENCOUNTER — Emergency Department (HOSPITAL_COMMUNITY)
Admission: EM | Admit: 2020-10-25 | Discharge: 2020-10-25 | Disposition: A | Discharge status: Home / Self Care | Payer: Medicare Other | Attending: Emergency Medicine | Admitting: Emergency Medicine

## 2020-10-25 ENCOUNTER — Emergency Department (HOSPITAL_COMMUNITY): Payer: Medicare Other

## 2020-10-25 ENCOUNTER — Other Ambulatory Visit: Payer: Self-pay

## 2020-10-25 DIAGNOSIS — F0390 Unspecified dementia without behavioral disturbance: Secondary | ICD-10-CM | POA: Insufficient documentation

## 2020-10-25 DIAGNOSIS — S62511A Displaced fracture of proximal phalanx of right thumb, initial encounter for closed fracture: Secondary | ICD-10-CM | POA: Diagnosis not present

## 2020-10-25 DIAGNOSIS — S3991XA Unspecified injury of abdomen, initial encounter: Secondary | ICD-10-CM

## 2020-10-25 DIAGNOSIS — R519 Headache, unspecified: Secondary | ICD-10-CM | POA: Diagnosis not present

## 2020-10-25 DIAGNOSIS — Y92410 Unspecified street and highway as the place of occurrence of the external cause: Secondary | ICD-10-CM | POA: Insufficient documentation

## 2020-10-25 DIAGNOSIS — J45909 Unspecified asthma, uncomplicated: Secondary | ICD-10-CM | POA: Diagnosis not present

## 2020-10-25 DIAGNOSIS — Z87891 Personal history of nicotine dependence: Secondary | ICD-10-CM | POA: Insufficient documentation

## 2020-10-25 DIAGNOSIS — S20211A Contusion of right front wall of thorax, initial encounter: Secondary | ICD-10-CM | POA: Insufficient documentation

## 2020-10-25 DIAGNOSIS — S301XXA Contusion of abdominal wall, initial encounter: Secondary | ICD-10-CM | POA: Diagnosis not present

## 2020-10-25 DIAGNOSIS — S62324A Displaced fracture of shaft of fourth metacarpal bone, right hand, initial encounter for closed fracture: Secondary | ICD-10-CM | POA: Insufficient documentation

## 2020-10-25 DIAGNOSIS — M542 Cervicalgia: Secondary | ICD-10-CM | POA: Diagnosis not present

## 2020-10-25 DIAGNOSIS — S62316A Displaced fracture of base of fifth metacarpal bone, right hand, initial encounter for closed fracture: Secondary | ICD-10-CM | POA: Diagnosis not present

## 2020-10-25 DIAGNOSIS — K219 Gastro-esophageal reflux disease without esophagitis: Secondary | ICD-10-CM | POA: Insufficient documentation

## 2020-10-25 DIAGNOSIS — S6991XA Unspecified injury of right wrist, hand and finger(s), initial encounter: Secondary | ICD-10-CM | POA: Diagnosis present

## 2020-10-25 DIAGNOSIS — J449 Chronic obstructive pulmonary disease, unspecified: Secondary | ICD-10-CM | POA: Diagnosis not present

## 2020-10-25 DIAGNOSIS — S62304A Unspecified fracture of fourth metacarpal bone, right hand, initial encounter for closed fracture: Secondary | ICD-10-CM

## 2020-10-25 LAB — TYPE AND SCREEN
ABO/RH(D): O POS
Antibody Screen: NEGATIVE

## 2020-10-25 LAB — COMPREHENSIVE METABOLIC PANEL
ALT: 21 U/L (ref 0–44)
AST: 42 U/L — ABNORMAL HIGH (ref 15–41)
Albumin: 3.6 g/dL (ref 3.5–5.0)
Alkaline Phosphatase: 55 U/L (ref 38–126)
Anion gap: 8 (ref 5–15)
BUN: 8 mg/dL (ref 8–23)
CO2: 28 mmol/L (ref 22–32)
Calcium: 8.9 mg/dL (ref 8.9–10.3)
Chloride: 102 mmol/L (ref 98–111)
Creatinine, Ser: 1.12 mg/dL — ABNORMAL HIGH (ref 0.44–1.00)
GFR, Estimated: 47 mL/min — ABNORMAL LOW (ref >60)
Glucose, Bld: 127 mg/dL — ABNORMAL HIGH (ref 70–99)
Potassium: 4.4 mmol/L (ref 3.5–5.1)
Sodium: 138 mmol/L (ref 135–145)
Total Bilirubin: 0.8 mg/dL (ref 0.3–1.2)
Total Protein: 6.6 g/dL (ref 6.5–8.1)

## 2020-10-25 LAB — I-STAT CHEM 8, ED
BUN: 9 mg/dL (ref 8–23)
Calcium, Ion: 1.15 mmol/L (ref 1.15–1.40)
Chloride: 102 mmol/L (ref 98–111)
Creatinine, Ser: 1 mg/dL (ref 0.44–1.00)
Glucose, Bld: 127 mg/dL — ABNORMAL HIGH (ref 70–99)
HCT: 38 % (ref 36.0–46.0)
Hemoglobin: 12.9 g/dL (ref 12.0–15.0)
Potassium: 4.2 mmol/L (ref 3.5–5.1)
Sodium: 142 mmol/L (ref 135–145)
TCO2: 27 mmol/L (ref 22–32)

## 2020-10-25 LAB — CBC WITH DIFFERENTIAL/PLATELET
Abs Immature Granulocytes: 0.28 10*3/uL — ABNORMAL HIGH (ref 0.00–0.07)
Basophils Absolute: 0 10*3/uL (ref 0.0–0.1)
Basophils Relative: 0 %
Eosinophils Absolute: 0 10*3/uL (ref 0.0–0.5)
Eosinophils Relative: 0 %
HCT: 37.1 % (ref 36.0–46.0)
Hemoglobin: 12.1 g/dL (ref 12.0–15.0)
Immature Granulocytes: 2 %
Lymphocytes Relative: 6 %
Lymphs Abs: 0.9 10*3/uL (ref 0.7–4.0)
MCH: 31.8 pg (ref 26.0–34.0)
MCHC: 32.6 g/dL (ref 30.0–36.0)
MCV: 97.6 fL (ref 80.0–100.0)
Monocytes Absolute: 0.9 10*3/uL (ref 0.1–1.0)
Monocytes Relative: 6 %
Neutro Abs: 12.4 10*3/uL — ABNORMAL HIGH (ref 1.7–7.7)
Neutrophils Relative %: 86 %
Platelets: 247 10*3/uL (ref 150–400)
RBC: 3.8 MIL/uL — ABNORMAL LOW (ref 3.87–5.11)
RDW: 13.8 % (ref 11.5–15.5)
WBC: 14.5 10*3/uL — ABNORMAL HIGH (ref 4.0–10.5)
nRBC: 0 % (ref 0.0–0.2)

## 2020-10-25 LAB — URINALYSIS, ROUTINE W REFLEX MICROSCOPIC
Bilirubin Urine: NEGATIVE
Glucose, UA: NEGATIVE mg/dL
Hgb urine dipstick: NEGATIVE
Ketones, ur: NEGATIVE mg/dL
Leukocytes,Ua: NEGATIVE
Nitrite: NEGATIVE
Protein, ur: NEGATIVE mg/dL
Specific Gravity, Urine: 1.016 (ref 1.005–1.030)
pH: 5 (ref 5.0–8.0)

## 2020-10-25 LAB — LACTIC ACID, PLASMA
Lactic Acid, Venous: 2.3 mmol/L (ref 0.5–1.9)
Lactic Acid, Venous: 2.3 mmol/L (ref 0.5–1.9)

## 2020-10-25 LAB — PROTIME-INR
INR: 1 (ref 0.8–1.2)
Prothrombin Time: 12.6 seconds (ref 11.4–15.2)

## 2020-10-25 MED ORDER — FENTANYL CITRATE (PF) 100 MCG/2ML IJ SOLN
50.0000 ug | Freq: Once | INTRAMUSCULAR | Status: AC
  Administered 2020-10-25: 50 ug via INTRAVENOUS
  Filled 2020-10-25: qty 2

## 2020-10-25 MED ORDER — SODIUM CHLORIDE 0.9 % IV BOLUS
500.0000 mL | Freq: Once | INTRAVENOUS | Status: AC
  Administered 2020-10-25: 500 mL via INTRAVENOUS

## 2020-10-25 MED ORDER — IOHEXOL 300 MG/ML  SOLN
100.0000 mL | Freq: Once | INTRAMUSCULAR | Status: AC | PRN
  Administered 2020-10-25: 100 mL via INTRAVENOUS

## 2020-10-25 NOTE — ED Notes (Signed)
Pt stands with assistance x1. Walks 5 steps to wheelchair, sits, and returns to bed without incident. Takes full steps. Guards lower quadrants due to pain.
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
## 2020-10-25 NOTE — Progress Notes (Signed)
Orthopedic Tech Progress Note Patient Details:  [name] [birth year] [id number]  Ortho Devices Type of Ortho Device: Ulna gutter splint,Thumb spica splint Splint Material: Fiberglass Ortho Device/Splint Location: Right Upper Extremity Ortho Device/Splint Interventions: Ordered,Application   Post Interventions Patient Tolerated: Well Instructions Provided: Care of device,Poper ambulation with device   Debbie Morris P Lorel Monaco 10/25/2020, 8:02 PM

## 2020-10-25 NOTE — ED Notes (Signed)
Unable to coplete triage questions d/t pt's dementia. No family at bedside.

## 2020-10-30 ENCOUNTER — Encounter (HOSPITAL_COMMUNITY): Payer: Self-pay | Admitting: Emergency Medicine

## 2020-10-30 ENCOUNTER — Other Ambulatory Visit: Payer: Self-pay

## 2020-10-30 ENCOUNTER — Emergency Department (HOSPITAL_COMMUNITY)
Admission: EM | Admit: 2020-10-30 | Discharge: 2020-11-02 | Disposition: A | Discharge status: Home / Self Care | Payer: Medicare Other | Attending: Emergency Medicine | Admitting: Emergency Medicine

## 2020-10-30 DIAGNOSIS — J449 Chronic obstructive pulmonary disease, unspecified: Secondary | ICD-10-CM | POA: Insufficient documentation

## 2020-10-30 DIAGNOSIS — Z87891 Personal history of nicotine dependence: Secondary | ICD-10-CM | POA: Insufficient documentation

## 2020-10-30 DIAGNOSIS — S60221A Contusion of right hand, initial encounter: Secondary | ICD-10-CM | POA: Insufficient documentation

## 2020-10-30 DIAGNOSIS — J45909 Unspecified asthma, uncomplicated: Secondary | ICD-10-CM | POA: Diagnosis not present

## 2020-10-30 DIAGNOSIS — Y92410 Unspecified street and highway as the place of occurrence of the external cause: Secondary | ICD-10-CM | POA: Diagnosis not present

## 2020-10-30 DIAGNOSIS — F0390 Unspecified dementia without behavioral disturbance: Secondary | ICD-10-CM | POA: Insufficient documentation

## 2020-10-30 DIAGNOSIS — S6991XA Unspecified injury of right wrist, hand and finger(s), initial encounter: Secondary | ICD-10-CM | POA: Diagnosis present

## 2020-10-30 DIAGNOSIS — Z7689 Persons encountering health services in other specified circumstances: Secondary | ICD-10-CM | POA: Insufficient documentation

## 2020-10-30 LAB — I-STAT CHEM 8, ED
BUN: 24 mg/dL — ABNORMAL HIGH (ref 8–23)
Calcium, Ion: 1.11 mmol/L — ABNORMAL LOW (ref 1.15–1.40)
Chloride: 101 mmol/L (ref 98–111)
Creatinine, Ser: 1.1 mg/dL — ABNORMAL HIGH (ref 0.44–1.00)
Glucose, Bld: 122 mg/dL — ABNORMAL HIGH (ref 70–99)
HCT: 30 % — ABNORMAL LOW (ref 36.0–46.0)
Hemoglobin: 10.2 g/dL — ABNORMAL LOW (ref 12.0–15.0)
Potassium: 4 mmol/L (ref 3.5–5.1)
Sodium: 136 mmol/L (ref 135–145)
TCO2: 26 mmol/L (ref 22–32)

## 2020-10-30 MED ORDER — ACETAMINOPHEN 325 MG PO TABS
650.0000 mg | ORAL_TABLET | ORAL | Status: DC | PRN
  Administered 2020-10-30 – 2020-11-01 (×4): 650 mg via ORAL
  Filled 2020-10-30 (×4): qty 2

## 2020-10-30 NOTE — ED Notes (Signed)
ED Provider at bedside. 
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
## 2020-10-30 NOTE — ED Notes (Signed)
Pt refusing ambulation at this time.
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
## 2020-10-30 NOTE — ED Notes (Signed)
Pt placed in hospital bed at this time. Brief changed and new clean gown placed on pt.
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
## 2020-10-30 NOTE — Discharge Planning (Signed)
RNCM consulted regarding safe discharge planning ([facility] Pl[facility]).  Physical Therapy evaluation placed; will follow up after recommendations from PT.
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
## 2020-10-31 NOTE — Progress Notes (Signed)
CSW spoke with Debbie Morris from [facility] who stated there are no beds today but might be one tomorrow. Ms. Debbie Morris met with family today and was willing to accept her for placement. CSW will contact Kristy in the morning.

## 2020-10-31 NOTE — NC FL2 (Signed)
[place] LEVEL OF CARE SCREENING TOOL     IDENTIFICATION  Patient Name: Nerine Pulse Birthdate: [birth year] Sex: female Admission Date (Current Location): 10/30/2020  [facility] and [place] Number:  Herbalist and Address:  [facility] [place], [place] [address] [place], [place] [address]      Provider Number:    Attending Physician Name and Address:  Default, Provider, MD  Relative Name and Phone Number:  Kennyth Lose and Loleta Books, Brother and sister in law, [phone]    Current Level of Care: SNF Recommended Level of Care: [place] Prior Approval Number:    Date Approved/Denied:   PASRR Number: [id number]  Discharge Plan: SNF    Current Diagnoses: Patient Active Problem List   Diagnosis Date Noted   HYPERLIPIDEMIA 09/15/2010   HEMORRHOIDS 09/15/2010   COPD 09/15/2010   GERD 09/15/2010   DIVERTICULITIS OF COLON 09/15/2010   ARTHRITIS 09/15/2010   ABDOMINAL PAIN, LEFT LOWER QUADRANT 09/15/2010   ANXIETY 04/21/2008   DEPRESSION 04/21/2008   CATARACTS 04/21/2008   ASTHMA, UNSPECIFIED 04/21/2008   ESOPHAGEAL STRICTURE 04/21/2008   HIATAL HERNIA 04/21/2008   DIVERTICULOSIS OF COLON 04/21/2008   IRRITABLE BOWEL SYNDROME 04/21/2008   ACUTE PANCREATITIS 04/21/2008   COLONIC POLYPS, HYPERPLASTIC, HX OF 04/21/2008    Orientation RESPIRATION BLADDER Height & Weight     Self  Normal Incontinent Weight:   Height:     BEHAVIORAL SYMPTOMS/MOOD NEUROLOGICAL BOWEL NUTRITION STATUS      Incontinent    AMBULATORY STATUS COMMUNICATION OF NEEDS Skin   Extensive Assist Verbally Normal                       Personal Care Assistance Level of Assistance  Feeding,Bathing,Dressing Bathing Assistance: Limited assistance Feeding assistance: Independent Dressing Assistance: Limited assistance     Functional Limitations Info             SPECIAL CARE FACTORS FREQUENCY                        Contractures Contractures Info: Not present    Additional Factors Info                  Current Medications (10/31/2020):  This is the current hospital active medication list Current Facility-Administered Medications  Medication Dose Route Frequency Provider Last Rate Last Admin   acetaminophen (TYLENOL) tablet 650 mg  650 mg Oral Q4H PRN Dorie Rank, MD   650 mg at 10/31/20 1202   Current Outpatient Medications  Medication Sig Dispense Refill   BREO ELLIPTA 100-25 MCG/INH AEPB Inhale 1 puff into the lungs daily as needed for shortness of breath.     cyanocobalamin (,VITAMIN B-12,) 1000 MCG/ML injection Inject 1,000 mcg into the muscle every 30 (thirty) days.     EPIPEN 2-PAK 0.3 MG/0.3ML SOAJ injection Inject 0.3 mg into the muscle as needed for anaphylaxis.  0   cetirizine (ZYRTEC) 10 MG tablet Take 1 tablet (10 mg total) by mouth daily. 1 po q day prn allergies (Patient not taking: No sig reported) 30 tablet 0   dicyclomine (BENTYL) 10 MG capsule Take 1 capsule (10 mg total) by mouth every 8 (eight) hours as needed for spasms. (Patient not taking: No sig reported) 30 capsule 3     Discharge Medications: Please see discharge summary for a list of discharge medications.  Relevant Imaging Results:  Relevant Lab Results:   Additional  Information SSN: [id number]  Raina Mina, LCSWA

## 2020-10-31 NOTE — ED Notes (Signed)
Pt uncomfortably in bed, assisted to seated position on side of bed. Pt continues to c/o of increased chest pain when she lays back
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
## 2020-11-01 NOTE — Progress Notes (Signed)
Patient is accepted at [facility]. CSW spoke with Marita Kansas in admissions and was told there might be a bed today. Marita Kansas told CSW she would call her in a hour to let her know.

## 2020-11-01 NOTE — Progress Notes (Addendum)
CSW received call from [place] inquiring about pts prior living situation and what equipment she used. CSW spoke with pts family who confirm that they did have someone in the home with the pt but the pt was independent with ambulation and used no equipment. CSW updated [employer] with this information. TOC to follow.   Addendum 8:25pm: CSW spoke with [employer] stating Josem Kaufmann has been approved. Member ID: [id number]. Ref #: [id number]. Auth ID: [id number]. The care coordinator is Alpha Gula. Josem Kaufmann is approved for three days with the start date being 3/15 and the next review date is 3/17. Any clinicals can be faxed to [phone].
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
## 2020-11-01 NOTE — ED Notes (Signed)
Report given to Janet RN 

## 2020-11-02 NOTE — Discharge Instructions (Signed)
Please follow up with your family doctor within the next week.  Continue your home medications as prescribed.

## 2020-11-02 NOTE — ED Notes (Signed)
PTAR called at 1120 for transport to [facility]

## 2020-11-02 NOTE — Progress Notes (Signed)
Patient has been approved to go to [facility]. Patient will be going to room 709. Patient will be ready after 12 PM.

## 2020-11-02 NOTE — ED Notes (Signed)
I faxed pt's AVS to [facility].
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
## 2023-06-11 ENCOUNTER — Emergency Department: Payer: Medicare Other

## 2023-06-11 ENCOUNTER — Emergency Department
Admission: EM | Admit: 2023-06-11 | Discharge: 2023-06-11 | Disposition: A | Discharge status: Home / Self Care | Payer: Medicare Other | Attending: Emergency Medicine | Admitting: Emergency Medicine

## 2023-06-11 DIAGNOSIS — F0390 Unspecified dementia without behavioral disturbance: Secondary | ICD-10-CM | POA: Insufficient documentation

## 2023-06-11 DIAGNOSIS — R1031 Right lower quadrant pain: Secondary | ICD-10-CM | POA: Diagnosis present

## 2023-06-11 DIAGNOSIS — J449 Chronic obstructive pulmonary disease, unspecified: Secondary | ICD-10-CM | POA: Diagnosis not present

## 2023-06-11 DIAGNOSIS — R1030 Lower abdominal pain, unspecified: Secondary | ICD-10-CM

## 2023-06-11 LAB — COMPREHENSIVE METABOLIC PANEL
ALT: 9 U/L (ref 0–44)
AST: 15 U/L (ref 15–41)
Albumin: 4.1 g/dL (ref 3.5–5.0)
Alkaline Phosphatase: 54 U/L (ref 38–126)
Anion gap: 10 (ref 5–15)
BUN: 33 mg/dL — ABNORMAL HIGH (ref 8–23)
CO2: 26 mmol/L (ref 22–32)
Calcium: 8.7 mg/dL — ABNORMAL LOW (ref 8.9–10.3)
Chloride: 101 mmol/L (ref 98–111)
Creatinine, Ser: 0.97 mg/dL (ref 0.44–1.00)
GFR, Estimated: 55 mL/min — ABNORMAL LOW (ref >60)
Glucose, Bld: 96 mg/dL (ref 70–99)
Potassium: 4.8 mmol/L (ref 3.5–5.1)
Sodium: 137 mmol/L (ref 135–145)
Total Bilirubin: 0.6 mg/dL (ref 0.3–1.2)
Total Protein: 7.4 g/dL (ref 6.5–8.1)

## 2023-06-11 LAB — CBC
HCT: 40.3 % (ref 36.0–46.0)
Hemoglobin: 13 g/dL (ref 12.0–15.0)
MCH: 31.3 pg (ref 26.0–34.0)
MCHC: 32.3 g/dL (ref 30.0–36.0)
MCV: 97.1 fL (ref 80.0–100.0)
Platelets: 297 10*3/uL (ref 150–400)
RBC: 4.15 MIL/uL (ref 3.87–5.11)
RDW: 12.6 % (ref 11.5–15.5)
WBC: 8.7 10*3/uL (ref 4.0–10.5)
nRBC: 0 % (ref 0.0–0.2)

## 2023-06-11 LAB — LIPASE, BLOOD: Lipase: 45 U/L (ref 11–51)

## 2023-06-11 MED ORDER — IOHEXOL 300 MG/ML  SOLN
100.0000 mL | Freq: Once | INTRAMUSCULAR | Status: AC | PRN
  Administered 2023-06-11: 100 mL via INTRAVENOUS

## 2023-06-11 NOTE — ED Provider Notes (Incomplete)
[facility] Provider Note   Event Date/Time   First MD Initiated Contact with Patient 06/11/23 1747     (approximate) History  Abdominal Pain  HPI Debbie Morris is a 89 y.o. female *** ROS: Patient currently denies any vision changes, tinnitus, difficulty speaking, facial droop, sore throat, chest pain, shortness of breath, abdominal pain, nausea/vomiting/diarrhea, dysuria, or weakness/numbness/paresthesias in any extremity   Physical Exam  Triage Vital Signs: ED Triage Vitals  Encounter Vitals Group     BP 06/11/23 1614 137/65     Systolic BP Percentile --      Diastolic BP Percentile --      Pulse Rate 06/11/23 1614 (!) 104     Resp 06/11/23 1614 18     Temp 06/11/23 1618 98.2 F (36.8 C)     Temp Source 06/11/23 1614 Oral     SpO2 06/11/23 1614 94 %     Weight --      Height 06/11/23 1611 5\' 3"  (1.6 m)     Head Circumference --      Peak Flow --      Pain Score 06/11/23 1610 6     Pain Loc --      Pain Education --      Exclude from Growth Chart --    Most recent vital signs: Vitals:   06/11/23 1614 06/11/23 1618  BP: 137/65   Pulse: (!) 104   Resp: 18   Temp:  98.2 F (36.8 C)  SpO2: 94%    General: Awake, oriented x4.*** CV:  Good peripheral perfusion. *** Resp:  Normal effort. *** Abd:  No distention. *** Other:  *** Resting comfortably in no acute distress ED Results / Procedures / Treatments  Labs (all labs ordered are listed, but only abnormal results are displayed) Labs Reviewed  COMPREHENSIVE METABOLIC PANEL - Abnormal; Notable for the following components:      Result Value   BUN 33 (*)    Calcium 8.7 (*)    GFR, Estimated 55 (*)    All other components within normal limits  LIPASE, BLOOD  CBC  URINALYSIS, ROUTINE W REFLEX MICROSCOPIC   EKG ED ECG REPORT I, Merwyn Katos, the attending physician, personally viewed and interpreted this ECG. Date: 06/11/2023 EKG Time: *** Rate: *** Rhythm: normal sinus  rhythm QRS Axis: normal Intervals: normal ST/T Wave abnormalities: normal Narrative Interpretation: no evidence of acute ischemia RADIOLOGY ED MD interpretation:  *** -Agree with radiology assessment Official radiology report(s): No results found. PROCEDURES: Critical Care performed: {CriticalCareYesNo:19197::"Yes, see critical care procedure note(s)","No"} Procedures MEDICATIONS ORDERED IN ED: Medications - No data to display IMPRESSION / MDM / ASSESSMENT AND PLAN / ED COURSE  I reviewed the triage vital signs and the nursing notes.                             The patient is on the cardiac monitor to evaluate for evidence of arrhythmia and/or significant heart rate changes. Patient's presentation is most consistent with {EM COPA:27473} {Remember to include, when applicable, any/all of the following data: independent review of imaging independent review of labs (comment specifically on pertinent positives and negatives) review of specific prior hospitalizations, PCP/specialist notes, etc. discuss meds given and prescribed document any discussion with consultants (including hospitalists) any clinical decision tools you used and why (PECARN, NEXUS, etc.) did you consider admitting the patient? document social determinants of health affecting patient's care (  homelessness, inability to follow up in a timely fashion, etc) document any pre-existing conditions increasing risk on current visit (e.g. diabetes and HTN increasing danger of high-risk chest pain/ACS) describes what meds you gave (especially parenteral) and why any other interventions?:1}   FINAL CLINICAL IMPRESSION(S) / ED DIAGNOSES   Final diagnoses:  None   Rx / DC Orders   ED Discharge Orders     None      Note:  This document was prepared using Dragon voice recognition software and may include unintentional dictation errors.

## 2023-06-11 NOTE — ED Notes (Signed)
This RN called [facility] and gave report to caregiver Shaune Spittle. Informed of situation and that pt is on her way back with family will need assistance getting out of car and back to room on arrival.

## 2023-06-11 NOTE — ED Notes (Signed)
Called CT to inform them about 20G IV placement for scan.

## 2023-06-11 NOTE — ED Triage Notes (Signed)
Arrives via [facility] from [facility] [place]. C/O abdominal pain x 1 month. Last BM last night.  12 lead wnl. VS wnl.

## 2023-06-11 NOTE — ED Provider Notes (Signed)
[facility] Provider Note    Event Date/Time   First MD Initiated Contact with Patient 06/11/23 1752     (approximate)   History   Abdominal Pain   HPI Debbie Morris is a 89 y.o. female with HLD, COPD, hiatal hernia, pancreatitis, dementia presenting today for right lower quadrant abdominal pain.  Patient comes from memory care unit and most of history obtained by speaking with a member of their team on the phone.  Reportedly had sharp left lower quadrant right lower quadrant abdominal pain that was new for her today.  Usually does not complain of pain symptoms.  They denied any fevers, nausea, vomiting, painful urination.  No changes in mental status.  No obvious history of surgeries that anyone is aware of to her abdomen.  Patient reporting pain in her lower abdomen at this time.  Otherwise denies any symptoms.  Patient is here with family.  Reviewed recent chart notes including ED visits.     Physical Exam   Triage Vital Signs: ED Triage Vitals  Encounter Vitals Group     BP 06/11/23 1614 137/65     Systolic BP Percentile --      Diastolic BP Percentile --      Pulse Rate 06/11/23 1614 (!) 104     Resp 06/11/23 1614 18     Temp 06/11/23 1618 98.2 F (36.8 C)     Temp Source 06/11/23 1614 Oral     SpO2 06/11/23 1614 94 %     Weight --      Height 06/11/23 1611 5\' 3"  (1.6 m)     Head Circumference --      Peak Flow --      Pain Score 06/11/23 1610 6     Pain Loc --      Pain Education --      Exclude from Growth Chart --     Most recent vital signs: Vitals:   06/11/23 1618 06/11/23 1851  BP:  (!) 141/59  Pulse:  79  Resp:  20  Temp: 98.2 F (36.8 C) 97.7 F (36.5 C)  SpO2:  100%   Physical Exam: I have reviewed the vital signs and nursing notes. General: Awake, alert, no acute distress.  Nontoxic appearing. Head:  Atraumatic, normocephalic.   ENT:  EOM intact, PERRL. Oral mucosa is pink and moist with no lesions. Neck:  Neck is supple with full range of motion, No meningeal signs. Cardiovascular:  RRR, No murmurs. Peripheral pulses palpable and equal bilaterally. Respiratory:  Symmetrical chest wall expansion.  No rhonchi, rales, or wheezes.  Good air movement throughout.  No use of accessory muscles.   Musculoskeletal:  No cyanosis or edema. Moving extremities with full ROM Abdomen:  Soft, tenderness palpation bilateral lower quadrants, nondistended. Neuro:  GCS 14, pleasantly confused, moving all four extremities, interacting appropriately. Speech clear. Psych:  Calm, appropriate.   Skin:  Warm, dry, no rash.    ED Results / Procedures / Treatments   Labs (all labs ordered are listed, but only abnormal results are displayed) Labs Reviewed  COMPREHENSIVE METABOLIC PANEL - Abnormal; Notable for the following components:      Result Value   BUN 33 (*)    Calcium 8.7 (*)    GFR, Estimated 55 (*)    All other components within normal limits  LIPASE, BLOOD  CBC  URINALYSIS, ROUTINE W REFLEX MICROSCOPIC     EKG    RADIOLOGY Independently interpreted CT imaging  with no acute pathology   PROCEDURES:  Critical Care performed: No  Procedures   MEDICATIONS ORDERED IN ED: Medications  iohexol (OMNIPAQUE) 300 MG/ML solution 100 mL (100 mLs Intravenous Contrast Given 06/11/23 1903)     IMPRESSION / MDM / ASSESSMENT AND PLAN / ED COURSE  I reviewed the triage vital signs and the nursing notes.                              Differential diagnosis includes, but is not limited to, diverticulitis, appendicitis, pancreatitis, acute cystitis, constipation, SBO  Patient's presentation is most consistent with acute complicated illness / injury requiring diagnostic workup.  Patient is a 89 year old female presenting today for generalized abdominal pain without associated symptoms.  Vital signs are stable.  Pain complaints are minimal on arrival here laboratory workup largely unremarkable with normal  CBC, lipase, and CMP.  CT abdomen/pelvis shows no acute intra-abdominal pathology.  Difficult time getting patient's urine given her dementia history and not cooperating with exam.  Family at bedside note that she has had urine tested recently at facility and has had negative UAs.  They did not want to wait in the ED any further for repeat urine testing she was otherwise well and will have the facility repeat this in a couple of days.  I do think patient safer discharge at this time and they are given strict return precautions for worsening symptoms.  Told to follow-up with her PCP.  The patient is on the cardiac monitor to evaluate for evidence of arrhythmia and/or significant heart rate changes. Clinical Course as of 06/11/23 2153  Tue Jun 11, 2023  1903 Comprehensive metabolic panelTheda Sers [DW]  1610 Lipase: 45 [DW]  1903 CBC Unremarkable [DW]  2146 CT ABDOMEN PELVIS W CONTRAST No acute intra-abdominal pathology [DW]    Clinical Course User Index [DW] Janith Lima, MD     FINAL CLINICAL IMPRESSION(S) / ED DIAGNOSES   Final diagnoses:  Lower abdominal pain     Rx / DC Orders   ED Discharge Orders     None        Note:  This document was prepared using Dragon voice recognition software and may include unintentional dictation errors.   Janith Lima, MD 06/11/23 2155

## 2023-06-11 NOTE — Discharge Instructions (Signed)
You can continue to have the facility check her urine as needed to evaluate for urinary tract infections.  Otherwise follow-up with your primary care provider.  Return to the ED for any worsening symptoms

## 2023-06-11 NOTE — ED Triage Notes (Signed)
Pt presents to the ED via [facility] from [facility] where she resides in the memory care unit. Pt reports RLQ abdominal pain. Pt is accompanied by sister-in-law.

## 2024-08-29 ENCOUNTER — Emergency Department
Admission: EM | Admit: 2024-08-29 | Discharge: 2024-08-29 | Disposition: A | Discharge status: Home / Self Care | Attending: Emergency Medicine | Admitting: Emergency Medicine

## 2024-08-29 ENCOUNTER — Emergency Department

## 2024-08-29 ENCOUNTER — Other Ambulatory Visit: Payer: Self-pay

## 2024-08-29 DIAGNOSIS — R059 Cough, unspecified: Secondary | ICD-10-CM | POA: Diagnosis present

## 2024-08-29 DIAGNOSIS — F0390 Unspecified dementia without behavioral disturbance: Secondary | ICD-10-CM | POA: Insufficient documentation

## 2024-08-29 DIAGNOSIS — J069 Acute upper respiratory infection, unspecified: Secondary | ICD-10-CM | POA: Insufficient documentation

## 2024-08-29 DIAGNOSIS — D72829 Elevated white blood cell count, unspecified: Secondary | ICD-10-CM | POA: Insufficient documentation

## 2024-08-29 DIAGNOSIS — J4489 Other specified chronic obstructive pulmonary disease: Secondary | ICD-10-CM | POA: Diagnosis not present

## 2024-08-29 HISTORY — DX: Unspecified dementia, unspecified severity, without behavioral disturbance, psychotic disturbance, mood disturbance, and anxiety: F03.90

## 2024-08-29 LAB — BASIC METABOLIC PANEL WITH GFR
Anion gap: 10 (ref 5–15)
BUN: 22 mg/dL (ref 8–23)
CO2: 28 mmol/L (ref 22–32)
Calcium: 9.4 mg/dL (ref 8.9–10.3)
Chloride: 103 mmol/L (ref 98–111)
Creatinine, Ser: 1.06 mg/dL — ABNORMAL HIGH (ref 0.44–1.00)
GFR, Estimated: 49 mL/min — ABNORMAL LOW
Glucose, Bld: 119 mg/dL — ABNORMAL HIGH (ref 70–99)
Potassium: 4.3 mmol/L (ref 3.5–5.1)
Sodium: 141 mmol/L (ref 135–145)

## 2024-08-29 LAB — CBC WITH DIFFERENTIAL/PLATELET
Abs Immature Granulocytes: 0.12 K/uL — ABNORMAL HIGH (ref 0.00–0.07)
Basophils Absolute: 0.1 K/uL (ref 0.0–0.1)
Basophils Relative: 0 %
Eosinophils Absolute: 0 K/uL (ref 0.0–0.5)
Eosinophils Relative: 0 %
HCT: 41.4 % (ref 36.0–46.0)
Hemoglobin: 13.1 g/dL (ref 12.0–15.0)
Immature Granulocytes: 1 %
Lymphocytes Relative: 8 %
Lymphs Abs: 1.4 K/uL (ref 0.7–4.0)
MCH: 29.1 pg (ref 26.0–34.0)
MCHC: 31.6 g/dL (ref 30.0–36.0)
MCV: 92 fL (ref 80.0–100.0)
Monocytes Absolute: 1.7 K/uL — ABNORMAL HIGH (ref 0.1–1.0)
Monocytes Relative: 10 %
Neutro Abs: 13.4 K/uL — ABNORMAL HIGH (ref 1.7–7.7)
Neutrophils Relative %: 81 %
Platelets: 374 K/uL (ref 150–400)
RBC: 4.5 MIL/uL (ref 3.87–5.11)
RDW: 12.8 % (ref 11.5–15.5)
WBC: 16.6 K/uL — ABNORMAL HIGH (ref 4.0–10.5)
nRBC: 0 % (ref 0.0–0.2)

## 2024-08-29 MED ORDER — ONDANSETRON 4 MG PO TBDP: 4.0000 mg | ORAL_TABLET | Freq: Three times a day (TID) | ORAL | 0 refills | Status: AC | PRN

## 2024-08-29 MED ORDER — DOXYCYCLINE HYCLATE 100 MG PO CAPS: 100.0000 mg | ORAL_CAPSULE | Freq: Two times a day (BID) | ORAL | 0 refills | Status: AC

## 2024-08-29 MED ORDER — ONDANSETRON 4 MG PO TBDP
4.0000 mg | ORAL_TABLET | Freq: Once | ORAL | Status: AC
  Administered 2024-08-29: 4 mg via ORAL
  Filled 2024-08-29 (×2): qty 1

## 2024-08-29 MED ORDER — GUAIFENESIN 100 MG/5ML PO LIQD: 10.0000 mL | ORAL | 0 refills | Status: AC | PRN

## 2024-08-29 MED ORDER — DOXYCYCLINE HYCLATE 100 MG PO TABS
100.0000 mg | ORAL_TABLET | Freq: Once | ORAL | Status: AC
  Administered 2024-08-29: 100 mg via ORAL
  Filled 2024-08-29: qty 1

## 2024-08-29 MED ORDER — GUAIFENESIN 100 MG/5ML PO LIQD
10.0000 mL | Freq: Once | ORAL | Status: AC
  Administered 2024-08-29: 10 mL via ORAL
  Filled 2024-08-29: qty 10

## 2024-08-29 MED ORDER — MELATONIN 5 MG PO TABS
5.0000 mg | ORAL_TABLET | Freq: Once | ORAL | Status: AC
  Administered 2024-08-29: 5 mg via ORAL
  Filled 2024-08-29: qty 1

## 2024-08-29 NOTE — ED Notes (Signed)
 EDT attempted to draw blood. Pt yelling and uncooperative

## 2024-08-29 NOTE — ED Provider Notes (Signed)
 "  [facility] Provider Note    Event Date/Time   First MD Initiated Contact with Patient 08/29/24 1501     (approximate)   History   Chief Complaint: Cough   HPI  Debbie Morris is a 89 y.o. female with a history of COPD, dementia who is brought to the ED due to cough for the past week, nonproductive.  No fever or vomiting.  Normal oral intake.  Over the last 24 hours she has developed loose bowel movements, and cough has become increasingly congested sounding.  Family notes that there have been several flu cases in the patient's memory care unit.        Past Medical History:  Diagnosis Date   Anxiety    Arthritis    Asthma    Barrett esophagus    Cataract    COPD (chronic obstructive pulmonary disease) (HCC)    Dementia (HCC)    Depression    Diverticulosis    Hemorrhoids    Hiatal hernia    Hyperlipidemia    Irritable bowel    Pancreatitis    Shingles 08/20/1996   Back and around to torso   Stricture of esophagus     Current Outpatient Rx   Order #: [id number] Class: Normal   Order #: [id number] Class: Normal   Order #: [id number] Class: Normal   Order #: [id number] Class: Historical Med   Order #: [id number] Class: Historical Med   Order #: [id number] Class: Historical Med   Order #: [id number] Class: Historical Med    Past Surgical History:  Procedure Laterality Date   ABDOMINAL HYSTERECTOMY     CATARACT EXTRACTION Bilateral    TONSILLECTOMY      Physical Exam   Triage Vital Signs: ED Triage Vitals  Encounter Vitals Group     BP 08/29/24 1040 (!) 159/71     Girls Systolic BP Percentile --      Girls Diastolic BP Percentile --      Boys Systolic BP Percentile --      Boys Diastolic BP Percentile --      Pulse Rate 08/29/24 1040 94     Resp 08/29/24 1040 20     Temp 08/29/24 1040 98.8 F (37.1 C)     Temp Source 08/29/24 1040 Oral     SpO2 08/29/24 1040 95 %     Weight 08/29/24 1044 120 lb (54.4 kg)     Height 08/29/24  1044 5' 4 (1.626 m)     Head Circumference --      Peak Flow --      Pain Score --      Pain Loc --      Pain Education --      Exclude from Growth Chart --     Most recent vital signs: Vitals:   08/29/24 1040  BP: (!) 159/71  Pulse: 94  Resp: 20  Temp: 98.8 F (37.1 C)  SpO2: 95%    General: Awake, no distress.  CV:  Good peripheral perfusion.  Regular rate rhythm Resp:  Normal effort.  Scant crackles diffusely, good air movement, no wheezing Abd:  No distention.  Soft nontender Other:  Moist oral mucosa   ED Results / Procedures / Treatments   Labs (all labs ordered are listed, but only abnormal results are displayed) Labs Reviewed  CBC WITH DIFFERENTIAL/PLATELET - Abnormal; Notable for the following components:      Result Value   WBC 16.6 (*)    Neutro Abs 13.4 (*)  Monocytes Absolute 1.7 (*)    Abs Immature Granulocytes 0.12 (*)    All other components within normal limits  BASIC METABOLIC PANEL WITH GFR - Abnormal; Notable for the following components:   Glucose, Bld 119 (*)    Creatinine, Ser 1.06 (*)    GFR, Estimated 49 (*)    All other components within normal limits  RESP PANEL BY RT-PCR (RSV, FLU A&B, COVID)  RVPGX2     EKG    RADIOLOGY Interpreted by me No consolidation or effusion.  Radiology report reviewed   PROCEDURES:  Procedures   MEDICATIONS ORDERED IN ED: Medications - No data to display   IMPRESSION / MDM / ASSESSMENT AND PLAN / ED COURSE  I reviewed the triage vital signs and the nursing notes.  DDx: Pneumonia, pleural effusion, AKI, electrolyte derangement, COVID, influenza, RSV  Patient's presentation is most consistent with acute presentation with potential threat to life or bodily function.  Patient presents with cough for the past week, increased malaise today.  Symptoms are not severe, vital signs are essentially normal.  Of breath.  Breath sounds are congested and with a week of symptoms and reported worsening  over the last 24 hours, suspect she may be developing atypical pneumonia.  Chest x-ray is clear.  Will start Doxy and guaifenesin .  Family request she be discharged back to her memory care now without waiting for the result.       FINAL CLINICAL IMPRESSION(S) / ED DIAGNOSES   Final diagnoses:  Viral URI with cough     Rx / DC Orders   ED Discharge Orders          Ordered    doxycycline  (VIBRAMYCIN ) 100 MG capsule  2 times daily        08/29/24 1542    guaiFENesin  (ROBITUSSIN) 100 MG/5ML liquid  Every 4 hours PRN        08/29/24 1542    ondansetron  (ZOFRAN -ODT) 4 MG disintegrating tablet  Every 8 hours PRN        08/29/24 1542             Note:  This document was prepared using Dragon voice recognition software and may include unintentional dictation errors.   Viviann Pastor, MD 08/29/24 1546  "

## 2024-08-29 NOTE — ED Notes (Signed)
 Patient adult brief changed prior to leaving the department.

## 2024-08-29 NOTE — ED Notes (Signed)
 Report given to [facility] at this time

## 2024-08-29 NOTE — ED Notes (Signed)
 Pt given water and offered ensure to drink

## 2024-08-29 NOTE — ED Notes (Signed)
 This RN attempted to contact [facility] x 2 to notify them of patient departure from the department.

## 2024-08-29 NOTE — ED Triage Notes (Signed)
 Pt to ED from [facility] via [facility] for cough since 1 week. Also had low appetite for 3 days, diarrhea yesterday. Expiratory wheezing and rhonchi per EMS.   Hx dementia. Pt stating I'm gonna knock the shit out of somebody!.   Pt was given 1 duoneb by EMS.  149/88, 88 HR, 96% RA, temp 97.8 oral, CBG 144  Respirations are unlabored, skin dry. Pt has deep, wet cough.  Unable to get bloodwork in triage, pt yelling screaming and uncooperative with trying to get pt arm straight for blood draw. Daughter who is POA is with pt. Daughter states recent sick people at facility.

## 2024-08-29 NOTE — ED Notes (Signed)
Pt changed in to clean brief
